# Patient Record
Sex: Female | Born: 1985 | Race: White | Hispanic: No | Marital: Married | Smoking: Current every day smoker
Health system: Southern US, Community
[De-identification: ages and names within clinical notes are randomized; demographics above are authoritative.]

## PROBLEM LIST (undated history)

## (undated) DIAGNOSIS — B1920 Unspecified viral hepatitis C without hepatic coma: Secondary | ICD-10-CM

## (undated) DIAGNOSIS — J449 Chronic obstructive pulmonary disease, unspecified: Secondary | ICD-10-CM

## (undated) DIAGNOSIS — F609 Personality disorder, unspecified: Secondary | ICD-10-CM

## (undated) DIAGNOSIS — G629 Polyneuropathy, unspecified: Secondary | ICD-10-CM

## (undated) DIAGNOSIS — F419 Anxiety disorder, unspecified: Secondary | ICD-10-CM

## (undated) DIAGNOSIS — M797 Fibromyalgia: Secondary | ICD-10-CM

## (undated) DIAGNOSIS — F329 Major depressive disorder, single episode, unspecified: Secondary | ICD-10-CM

## (undated) DIAGNOSIS — R569 Unspecified convulsions: Secondary | ICD-10-CM

## (undated) DIAGNOSIS — E669 Obesity, unspecified: Secondary | ICD-10-CM

## (undated) DIAGNOSIS — N390 Urinary tract infection, site not specified: Secondary | ICD-10-CM

## (undated) DIAGNOSIS — O1490 Unspecified pre-eclampsia, unspecified trimester: Secondary | ICD-10-CM

## (undated) DIAGNOSIS — F32A Depression, unspecified: Secondary | ICD-10-CM

## (undated) DIAGNOSIS — O1410 Severe pre-eclampsia, unspecified trimester: Secondary | ICD-10-CM

## (undated) DIAGNOSIS — D649 Anemia, unspecified: Secondary | ICD-10-CM

## (undated) DIAGNOSIS — R51 Headache: Secondary | ICD-10-CM

## (undated) HISTORY — PX: TONSILLECTOMY: SUR1361

## (undated) HISTORY — PX: WISDOM TOOTH EXTRACTION: SHX21

## (undated) HISTORY — PX: MOUTH SURGERY: SHX715

---
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
## 2012-02-29 ENCOUNTER — Emergency Department (HOSPITAL_COMMUNITY): Payer: Medicaid Other

## 2012-02-29 ENCOUNTER — Encounter (HOSPITAL_COMMUNITY): Payer: Self-pay | Admitting: Emergency Medicine

## 2012-02-29 ENCOUNTER — Emergency Department (HOSPITAL_COMMUNITY)
Admission: EM | Admit: 2012-02-29 | Discharge: 2012-02-29 | Disposition: A | Discharge status: Home / Self Care | Payer: Medicaid Other | Attending: Emergency Medicine | Admitting: Emergency Medicine

## 2012-02-29 DIAGNOSIS — F17200 Nicotine dependence, unspecified, uncomplicated: Secondary | ICD-10-CM | POA: Insufficient documentation

## 2012-02-29 DIAGNOSIS — M25551 Pain in right hip: Secondary | ICD-10-CM

## 2012-02-29 DIAGNOSIS — W010XXA Fall on same level from slipping, tripping and stumbling without subsequent striking against object, initial encounter: Secondary | ICD-10-CM | POA: Insufficient documentation

## 2012-02-29 DIAGNOSIS — S79919A Unspecified injury of unspecified hip, initial encounter: Secondary | ICD-10-CM | POA: Insufficient documentation

## 2012-02-29 DIAGNOSIS — Y929 Unspecified place or not applicable: Secondary | ICD-10-CM | POA: Insufficient documentation

## 2012-02-29 DIAGNOSIS — Y939 Activity, unspecified: Secondary | ICD-10-CM | POA: Insufficient documentation

## 2012-02-29 DIAGNOSIS — S79929A Unspecified injury of unspecified thigh, initial encounter: Secondary | ICD-10-CM | POA: Insufficient documentation

## 2012-02-29 HISTORY — DX: Unspecified pre-eclampsia, unspecified trimester: O14.90

## 2012-02-29 MED ORDER — KETOROLAC TROMETHAMINE 60 MG/2ML IM SOLN
60.0000 mg | Freq: Once | INTRAMUSCULAR | Status: AC
  Administered 2012-02-29: 60 mg via INTRAMUSCULAR
  Filled 2012-02-29: qty 2

## 2012-02-29 MED ORDER — OXYCODONE-ACETAMINOPHEN 5-325 MG PO TABS: 2.0000 | ORAL_TABLET | ORAL | Status: DC | PRN

## 2012-02-29 MED ORDER — OXYCODONE-ACETAMINOPHEN 5-325 MG PO TABS
2.0000 | ORAL_TABLET | Freq: Once | ORAL | Status: AC
  Administered 2012-02-29: 2 via ORAL
  Filled 2012-02-29: qty 2

## 2012-02-29 NOTE — ED Notes (Signed)
Patient states she tripped and fell over a tree stump and has right hip/right lower back pain.

## 2012-02-29 NOTE — ED Provider Notes (Signed)
History     CSN: [id number]  Arrival date & time 02/29/12  1515   First MD Initiated Contact with Patient 02/29/12 1725      Chief Complaint  Patient presents with  . Hip Pain    right hip and lower right back    (Consider location/radiation/quality/duration/timing/severity/associated sxs/prior treatment) HPI Comments: Patient is a 26 year old female who presents with right hip pain after tripping over a tree root and landing on her right hip. The pain is located in her right hip and does radiate to her back. The pain is described as aching and severe. The pain started gradually and progressively worsened since the onset. Pain made worse by movement and weight bearing. No alleviating factors. The patient has tried tylenol and ice for symptoms without relief. Associated symptoms include nothing. Patient denies fever, headache, NVD, chest pain, SOB, numbness/tingling, weakness, coolness of extremity. Patient denies any other injury.       Past Medical History  Diagnosis Date  . Pre-eclampsia     History reviewed. No pertinent past surgical history.  History reviewed. No pertinent family history.  History  Substance Use Topics  . Smoking status: Current Every Day Smoker -- 1.0 packs/day    Types: Cigarettes  . Smokeless tobacco: Not on file  . Alcohol Use: No    OB History    Grav Para Term Preterm Abortions TAB SAB Ect Mult Living                  Review of Systems  Musculoskeletal: Positive for back pain, arthralgias and gait problem.  All other systems reviewed and are negative.    Allergies  Latex; Penicillins; and Zyrtec  Home Medications  No current outpatient prescriptions on file.  BP 140/84  Pulse 65  Temp 98.3 F (36.8 C) (Oral)  Resp 20  Ht 5\' 2"  (1.575 m)  Wt 225 lb (102.059 kg)  BMI 41.15 kg/m2  SpO2 100%  LMP 02/26/2012  Physical Exam  Nursing note and vitals reviewed. Constitutional: She is oriented to person, place, and time. She  appears well-developed and well-nourished. No distress.  HENT:  Head: Normocephalic and atraumatic.  Eyes: Conjunctivae normal are normal.  Neck: Normal range of motion. Neck supple.  Cardiovascular: Normal rate, regular rhythm and intact distal pulses.  Exam reveals no gallop and no friction rub.   No murmur heard.      Sufficient capillary refill of distal extremities.   Pulmonary/Chest: Effort normal and breath sounds normal. She has no wheezes. She has no rales. She exhibits no tenderness.  Abdominal: Soft. She exhibits no distension.  Musculoskeletal: Normal range of motion. She exhibits tenderness.       Right hip tenderness to palpation of lateral joint.   Neurological: She is alert and oriented to person, place, and time. Coordination normal.       Strength and sensation equal and intact bilaterally. Speech is goal-oriented. Moves limbs without ataxia. Gait affected by right hip pain.   Skin: Skin is warm and dry. She is not diaphoretic.       No bruising of right hip.   Psychiatric: She has a normal mood and affect. Her behavior is normal.    ED Course  Procedures (including critical care time)  Labs Reviewed - No data to display Dg Lumbar Spine Complete  02/29/2012  *RADIOLOGY REPORT*  Clinical Data: Fall.  Low back pain.  LUMBAR SPINE - COMPLETE 4+ VIEW  Comparison: None.  Findings: Five lumbar  type vertebral bodies are present.  There is a mild S-shaped lumbar curvature.  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  The vertebral body height is preserved.  There are no pars defects.  No spondylolisthesis. Intervertebral disc spaces appear normal.  IMPRESSION: Negative.   Original Report Authenticated By: Andreas Newport, M.D.    Dg Hip Complete Right  02/29/2012  *RADIOLOGY REPORT*  Clinical Data: Fall.  Low back pain extending into the right hip.  RIGHT HIP - COMPLETE 2+ VIEW  Comparison: None.  Findings: Sacralization of the left L5 transverse process is  incidentally noted.  The SI joints appear within normal limits. Sacral arcades intact.  Obturator rings appear within normal limits.  The hip joint spaces are symmetric.  There is no fracture. Right obturator ring appears normal.  IMPRESSION: No acute osseous abnormality.   Original Report Authenticated By: Andreas Newport, M.D.      1. Hip pain, right       MDM  6:10 PM Patient's hip xrays are negative for fracture. Patient will be discharged with pain medication, crutches, and Ortho follow up if the pain does not resolve in a few days. I will give patient percocet here as she says she has a ride home. No signs of neurovascular compromise. Pain after injury, most likely contusion. No further evaluation needed at this time.         Emilia Beck, PA-C 03/05/12 [phone]

## 2012-03-06 NOTE — ED Provider Notes (Signed)
Medical screening examination/treatment/procedure(s) were performed by non-physician practitioner and as supervising physician I was immediately available for consultation/collaboration.  Toy Baker, MD 03/06/12 [phone]

## 2012-03-30 ENCOUNTER — Emergency Department (HOSPITAL_COMMUNITY)
Admission: EM | Admit: 2012-03-30 | Discharge: 2012-03-30 | Disposition: A | Discharge status: Home / Self Care | Payer: Medicaid Other | Attending: Emergency Medicine | Admitting: Emergency Medicine

## 2012-03-30 ENCOUNTER — Encounter (HOSPITAL_COMMUNITY): Payer: Self-pay

## 2012-03-30 DIAGNOSIS — N898 Other specified noninflammatory disorders of vagina: Secondary | ICD-10-CM | POA: Insufficient documentation

## 2012-03-30 DIAGNOSIS — N390 Urinary tract infection, site not specified: Secondary | ICD-10-CM

## 2012-03-30 DIAGNOSIS — M545 Low back pain, unspecified: Secondary | ICD-10-CM | POA: Insufficient documentation

## 2012-03-30 DIAGNOSIS — E669 Obesity, unspecified: Secondary | ICD-10-CM | POA: Insufficient documentation

## 2012-03-30 DIAGNOSIS — Z862 Personal history of diseases of the blood and blood-forming organs and certain disorders involving the immune mechanism: Secondary | ICD-10-CM | POA: Insufficient documentation

## 2012-03-30 DIAGNOSIS — R110 Nausea: Secondary | ICD-10-CM | POA: Insufficient documentation

## 2012-03-30 DIAGNOSIS — F17200 Nicotine dependence, unspecified, uncomplicated: Secondary | ICD-10-CM | POA: Insufficient documentation

## 2012-03-30 DIAGNOSIS — R350 Frequency of micturition: Secondary | ICD-10-CM | POA: Insufficient documentation

## 2012-03-30 DIAGNOSIS — Z8659 Personal history of other mental and behavioral disorders: Secondary | ICD-10-CM | POA: Insufficient documentation

## 2012-03-30 DIAGNOSIS — Z8669 Personal history of other diseases of the nervous system and sense organs: Secondary | ICD-10-CM | POA: Insufficient documentation

## 2012-03-30 DIAGNOSIS — R300 Dysuria: Secondary | ICD-10-CM | POA: Insufficient documentation

## 2012-03-30 DIAGNOSIS — Z79899 Other long term (current) drug therapy: Secondary | ICD-10-CM | POA: Insufficient documentation

## 2012-03-30 HISTORY — DX: Severe pre-eclampsia, unspecified trimester: O14.10

## 2012-03-30 HISTORY — DX: Anemia, unspecified: D64.9

## 2012-03-30 HISTORY — DX: Obesity, unspecified: E66.9

## 2012-03-30 HISTORY — DX: Headache: R51

## 2012-03-30 HISTORY — DX: Depression, unspecified: F32.A

## 2012-03-30 HISTORY — DX: Unspecified convulsions: R56.9

## 2012-03-30 HISTORY — DX: Major depressive disorder, single episode, unspecified: F32.9

## 2012-03-30 HISTORY — DX: Urinary tract infection, site not specified: N39.0

## 2012-03-30 LAB — URINALYSIS, ROUTINE W REFLEX MICROSCOPIC
Glucose, UA: NEGATIVE mg/dL
Protein, ur: NEGATIVE mg/dL
pH: 5.5 (ref 5.0–8.0)

## 2012-03-30 LAB — URINE MICROSCOPIC-ADD ON

## 2012-03-30 LAB — WET PREP, GENITAL
Clue Cells Wet Prep HPF POC: NONE SEEN
Trich, Wet Prep: NONE SEEN
Yeast Wet Prep HPF POC: NONE SEEN

## 2012-03-30 MED ORDER — DEXTROSE 5 % IV SOLN: 1.0000 g | Freq: Once | INTRAVENOUS | Status: DC

## 2012-03-30 MED ORDER — LEVOFLOXACIN IN D5W 750 MG/150ML IV SOLN
750.0000 mg | Freq: Once | INTRAVENOUS | Status: AC
  Administered 2012-03-30: 750 mg via INTRAVENOUS
  Filled 2012-03-30: qty 150

## 2012-03-30 MED ORDER — LEVOFLOXACIN 500 MG PO TABS: 500.0000 mg | ORAL_TABLET | Freq: Every day | ORAL | Status: DC

## 2012-03-30 NOTE — ED Notes (Signed)
Patient c/o left lower abdominal pain, left lower back and dysuria. Patient reports that she was seen by her PCP 2 weeks ago and was given an antibiotic x 7 days, but  Symptoms are worse.

## 2012-03-30 NOTE — ED Provider Notes (Signed)
History  This chart was scribed for Crystal Racer, MD by [name], ED Scribe. This patient was seen in room WA07/WA07 and the patient's care was started at 4:55 PM.  CSN: [id number]  Arrival date & time 03/30/12  1325   First MD Initiated Contact with Patient 03/30/12 1655      Chief Complaint  Patient presents with  . Abdominal Pain  . Back Pain  . Dysuria    Patient is a 26 y.o. female presenting with abdominal pain. The history is provided by the patient. No language interpreter was used.  Abdominal Pain The primary symptoms of the illness include abdominal pain, nausea, dysuria and vaginal discharge. The primary symptoms of the illness do not include fever, vomiting, diarrhea or vaginal bleeding. Episode onset: going on for 9 months since her pregnancy. The onset of the illness was gradual. The problem has been gradually worsening.  The abdominal pain is located in the suprapubic region. The abdominal pain radiates to the back. The abdominal pain is relieved by nothing. The abdominal pain is exacerbated by vomiting.  The dysuria is associated with frequency and urgency. The dysuria is not associated with hematuria.  The vaginal discharge is associated with dysuria.  The patient has not had a change in bowel habit. Additional symptoms associated with the illness include urgency, frequency and back pain. Symptoms associated with the illness do not include chills or hematuria. Significant associated medical issues do not include GERD, inflammatory bowel disease or diabetes.    Crystal Ayala is a 26 y.o. female who presents to the Emergency Department complaining of 9 months of recurrent lower abdominal pain that radiates to the lower back with associated nausea, urgency, frequency, dysuria, difficulty urinating and decreased urine that she attributes to a recurrent UTI since her the early part of her pregnancy (son is 80 months old currently). She states that she has been seen by  her PCP 2 to 3 weeks ago and has been treated with antibiotics ( an unknown 7 day antibiotic). She reports that the symptoms improved with the antibiotic but have come back and have been gradually worsening since. She also reports mild vaginal discharge but states that she had a pelvic exam performed by her PCP 2 to 3 weeks ago that was normal. She states that she gets finished her menstrual cycle and states that it was normal. She denies fever, emesis and hematuria as associated symptoms. She has a h/o depression, seizure and anemia. She is a current everyday smoker but denies alcohol use.  [facility] is PCP.   Past Medical History  Diagnosis Date  . Pre-eclampsia   . UTI (lower urinary tract infection)   . Headache   . Depression   . Obesity   . Preeclampsia, severe   . Seizure   . Anemia     Past Surgical History  Procedure Date  . Tonsillectomy   . Mouth surgery     Family History  Problem Relation Age of Onset  . Diabetes Mother   . Heart failure Mother   . Diabetes Father   . Heart failure Father     History  Substance Use Topics  . Smoking status: Current Every Day Smoker -- 1.0 packs/day    Types: Cigarettes  . Smokeless tobacco: Never Used  . Alcohol Use: No    No OB history provided.  Review of Systems  Constitutional: Negative for fever and chills.  Gastrointestinal: Positive for nausea and abdominal pain. Negative for vomiting and  diarrhea.  Genitourinary: Positive for dysuria, urgency, frequency, decreased urine volume, vaginal discharge and difficulty urinating. Negative for hematuria, flank pain and vaginal bleeding.  Musculoskeletal: Positive for back pain.  All other systems reviewed and are negative.    Allergies  Latex; Penicillins-throat swells per pt; and Zyrtec  Home Medications   Current Outpatient Rx  Name  Route  Sig  Dispense  Refill  . OXYCODONE-ACETAMINOPHEN 5-325 MG PO TABS   Oral   Take 2 tablets by mouth every 4 (four)  hours as needed for pain.   15 tablet   0   . SULFAMETHOXAZOLE-TMP DS 800-160 MG PO TABS   Oral   Take 1 tablet by mouth 2 (two) times daily.         Marland Kitchen LEVOFLOXACIN 500 MG PO TABS   Oral   Take 1 tablet (500 mg total) by mouth daily.   7 tablet   0      Triage Vitals: BP 127/90  Pulse 101  Temp 98 F (36.7 C) (Oral)  Resp 16  SpO2 97%  LMP 03/28/2012  Physical Exam  Nursing note and vitals reviewed. Constitutional: She is oriented to person, place, and time. She appears well-developed and well-nourished. No distress.  HENT:  Head: Normocephalic and atraumatic.  Mouth/Throat: Oropharynx is clear and moist.  Eyes: Conjunctivae normal and EOM are normal. Pupils are equal, round, and reactive to light.  Neck: Normal range of motion. Neck supple. No tracheal deviation present.  Cardiovascular: Normal rate, regular rhythm and normal heart sounds.  Exam reveals no gallop and no friction rub.   No murmur heard. Pulmonary/Chest: Effort normal and breath sounds normal. No respiratory distress.  Abdominal: Soft. She exhibits no mass. There is no tenderness. There is no rebound and no guarding.       No CVA tenderness  Genitourinary: Vaginal discharge found.       Mild amount of brownish discharge from the cervical os, no cervical motion tenderness, mild fundal tenderness  Musculoskeletal: Normal range of motion.       No flank tenderness  Neurological: She is alert and oriented to person, place, and time.  Skin: Skin is warm and dry.  Psychiatric: She has a normal mood and affect. Her behavior is normal.    ED Course  Procedures (including critical care time)  DIAGNOSTIC STUDIES: Oxygen Saturation is 97% on room air, adequate by my interpretation.    COORDINATION OF CARE: 5:20 PM- Discussed treatment plan which includes an UA and pelvic exam with pt at bedside and pt agreed to plan.  6:40 PM- informed pt of UA results. Discussed further treatment plan which includes IV  antibiotics and with pt at bedside and pt agreed to plan. Advised pt that she will need to follow up with PCP and she agreed.  6:45 PM- Ordered IV 750 mg Levaquin.  Labs Reviewed  URINALYSIS, ROUTINE W REFLEX MICROSCOPIC - Abnormal; Notable for the following:    APPearance CLOUDY (*)     Hgb urine dipstick LARGE (*)     Leukocytes, UA MODERATE (*)     All other components within normal limits  WET PREP, GENITAL - Abnormal; Notable for the following:    WBC, Wet Prep HPF POC FEW (*)     All other components within normal limits  URINE MICROSCOPIC-ADD ON - Abnormal; Notable for the following:    Squamous Epithelial / LPF FEW (*)     Bacteria, UA FEW (*)     All  other components within normal limits  PREGNANCY, URINE  GC/CHLAMYDIA PROBE AMP  URINE CULTURE   No results found.   1. UTI (urinary tract infection)       MDM  I personally performed the services described in this documentation, which was scribed in my presence. The recorded information has been reviewed and is accurate.    Crystal Racer, MD 03/31/12 [phone]

## 2012-03-30 NOTE — ED Notes (Signed)
Pt c/o lower abdominal and back pain. Pt sts hx of frequent UTI's. PT sts this feels like UTI. Pt also c/o burning with urination.

## 2012-03-31 LAB — GC/CHLAMYDIA PROBE AMP
CT Probe RNA: NEGATIVE
GC Probe RNA: NEGATIVE

## 2012-04-01 LAB — URINE CULTURE: Colony Count: >=100000

## 2012-04-02 NOTE — ED Notes (Signed)
+  Urine. Patient treated with Levaquin. Sensitive to same. Per protocol MD. °

## 2013-04-01 ENCOUNTER — Encounter (HOSPITAL_COMMUNITY): Payer: Self-pay | Admitting: Emergency Medicine

## 2013-04-01 ENCOUNTER — Emergency Department (HOSPITAL_COMMUNITY)
Admission: EM | Admit: 2013-04-01 | Discharge: 2013-04-01 | Disposition: A | Discharge status: Home / Self Care | Payer: Medicaid Other | Attending: Emergency Medicine | Admitting: Emergency Medicine

## 2013-04-01 DIAGNOSIS — R569 Unspecified convulsions: Secondary | ICD-10-CM | POA: Insufficient documentation

## 2013-04-01 DIAGNOSIS — Z880 Allergy status to penicillin: Secondary | ICD-10-CM | POA: Insufficient documentation

## 2013-04-01 DIAGNOSIS — Z888 Allergy status to other drugs, medicaments and biological substances status: Secondary | ICD-10-CM | POA: Insufficient documentation

## 2013-04-01 DIAGNOSIS — Z87440 Personal history of urinary (tract) infections: Secondary | ICD-10-CM | POA: Insufficient documentation

## 2013-04-01 DIAGNOSIS — J441 Chronic obstructive pulmonary disease with (acute) exacerbation: Secondary | ICD-10-CM | POA: Insufficient documentation

## 2013-04-01 DIAGNOSIS — R9431 Abnormal electrocardiogram [ECG] [EKG]: Secondary | ICD-10-CM | POA: Insufficient documentation

## 2013-04-01 DIAGNOSIS — Z3202 Encounter for pregnancy test, result negative: Secondary | ICD-10-CM | POA: Insufficient documentation

## 2013-04-01 DIAGNOSIS — F3289 Other specified depressive episodes: Secondary | ICD-10-CM | POA: Insufficient documentation

## 2013-04-01 DIAGNOSIS — Z91040 Latex allergy status: Secondary | ICD-10-CM | POA: Insufficient documentation

## 2013-04-01 DIAGNOSIS — IMO0001 Reserved for inherently not codable concepts without codable children: Secondary | ICD-10-CM | POA: Insufficient documentation

## 2013-04-01 DIAGNOSIS — R1032 Left lower quadrant pain: Secondary | ICD-10-CM | POA: Insufficient documentation

## 2013-04-01 DIAGNOSIS — F17200 Nicotine dependence, unspecified, uncomplicated: Secondary | ICD-10-CM | POA: Insufficient documentation

## 2013-04-01 DIAGNOSIS — R079 Chest pain, unspecified: Secondary | ICD-10-CM | POA: Insufficient documentation

## 2013-04-01 DIAGNOSIS — D649 Anemia, unspecified: Secondary | ICD-10-CM | POA: Insufficient documentation

## 2013-04-01 DIAGNOSIS — Z8742 Personal history of other diseases of the female genital tract: Secondary | ICD-10-CM | POA: Insufficient documentation

## 2013-04-01 DIAGNOSIS — Z79899 Other long term (current) drug therapy: Secondary | ICD-10-CM | POA: Insufficient documentation

## 2013-04-01 DIAGNOSIS — R109 Unspecified abdominal pain: Secondary | ICD-10-CM | POA: Insufficient documentation

## 2013-04-01 DIAGNOSIS — F329 Major depressive disorder, single episode, unspecified: Secondary | ICD-10-CM | POA: Insufficient documentation

## 2013-04-01 DIAGNOSIS — E669 Obesity, unspecified: Secondary | ICD-10-CM | POA: Insufficient documentation

## 2013-04-01 DIAGNOSIS — Z8669 Personal history of other diseases of the nervous system and sense organs: Secondary | ICD-10-CM | POA: Insufficient documentation

## 2013-04-01 LAB — URINALYSIS, ROUTINE W REFLEX MICROSCOPIC
Bilirubin Urine: NEGATIVE
Glucose, UA: NEGATIVE mg/dL
Hgb urine dipstick: NEGATIVE
Ketones, ur: NEGATIVE mg/dL
Protein, ur: NEGATIVE mg/dL
pH: 6 (ref 5.0–8.0)

## 2013-04-01 LAB — URINE MICROSCOPIC-ADD ON

## 2013-04-01 MED ORDER — AZITHROMYCIN 250 MG PO TABS: ORAL_TABLET | ORAL | Status: AC

## 2013-04-01 MED ORDER — TRAMADOL HCL 50 MG PO TABS
50.0000 mg | ORAL_TABLET | Freq: Once | ORAL | Status: AC
  Administered 2013-04-01: 50 mg via ORAL
  Filled 2013-04-01: qty 1

## 2013-04-01 NOTE — ED Notes (Signed)
Pt here with multiple complaints.  Pt states she has been dizzy and had a cough with green sputum all week.  Pt also c/o CP, abdominal pain LLQ, UTI symptoms, and back pain.  Pt states she has taken Ibuprofen for her symptoms.

## 2013-04-01 NOTE — ED Notes (Signed)
Pt discharged.Vital signs stable and GCS 15.Discharge instruction given. 

## 2013-04-02 NOTE — ED Provider Notes (Signed)
CSN: [id number]     Arrival date & time 04/01/13  1718 History   First MD Initiated Contact with Patient 04/01/13 1841     Chief Complaint  Patient presents with  . Cough/congestion  . Lower abdominal pain/ poss UTI      (Consider location/radiation/quality/duration/timing/severity/associated sxs/prior Treatment) HPI Crystal Ayala is a 27 y.o. female who presents to the emergency department with two complaints.  Lower abdominal pain: Patient reports that she is having LLQ pain.  Similar to numerous UTIs she has had in the past.  Associated with dysuria and malodorous urine.  No flank pain.  No fevers.  No vaginal discharge.  No problems with tolerating PO or defecation.  Still passing flatus.  Moderate in severity.  No pain radiation.  Worse with palpation.  Better with nothing. Cough/congestion: patient reports that she has smoked 2-3 packs a day for last 12 years of her life and has already been diagnosed with COPD at [facility] and is being treated with Advair and Albuterol.  She reports that over the last three days she has had an increase in mucous production and change in color.  She also reports a worsening cough during this time.  Described as mild.  No SOB, chest pain, or other symptoms.  Past Medical History  Diagnosis Date  . Pre-eclampsia   . UTI (lower urinary tract infection)   . Headache(784.0)   . Depression   . Obesity   . Preeclampsia, severe   . Seizure   . Anemia    Past Surgical History  Procedure Laterality Date  . Tonsillectomy    . Mouth surgery     Family History  Problem Relation Age of Onset  . Diabetes Mother   . Heart failure Mother   . Diabetes Father   . Heart failure Father    History  Substance Use Topics  . Smoking status: Current Every Day Smoker -- 1.00 packs/day    Types: Cigarettes  . Smokeless tobacco: Never Used  . Alcohol Use: No   OB History   Grav Para Term Preterm Abortions TAB SAB Ect Mult Living                  Review of Systems  Constitutional: Negative for fever and chills.  HENT: Negative for congestion and rhinorrhea.   Respiratory: Negative for cough and shortness of breath.   Cardiovascular: Negative for chest pain.  Gastrointestinal: Negative for nausea, vomiting, abdominal pain, diarrhea and abdominal distention.  Endocrine: Negative for polyuria.  Genitourinary: Negative for dysuria.  Musculoskeletal: Negative for neck pain and neck stiffness.  Skin: Negative for rash.  Neurological: Negative for headaches.  Psychiatric/Behavioral: Negative.     Allergies  Latex; Penicillins; Other; and Zyrtec  Home Medications   Current Outpatient Rx  Name  Route  Sig  Dispense  Refill  . ibuprofen (ADVIL,MOTRIN) 200 MG tablet   Oral   Take 800 mg by mouth every 8 (eight) hours as needed for moderate pain.         . pregabalin (LYRICA) 100 MG capsule   Oral   Take 100 mg by mouth 3 (three) times daily.         Marland Kitchen azithromycin (ZITHROMAX Z-PAK) 250 MG tablet      Follow package directions.  Take 500mg  by mouth on day 1 and 250mg  by mouth on days 2-5.   6 tablet   0    BP 114/66  Pulse 73  Temp(Src) 98.3 F (36.8  C) (Oral)  Resp 18  SpO2 98%  LMP 03/18/2013 Physical Exam  Nursing note and vitals reviewed. Constitutional: She is oriented to person, place, and time. She appears well-developed and well-nourished. No distress.  HENT:  Head: Normocephalic and atraumatic.  Right Ear: External ear normal.  Left Ear: External ear normal.  Nose: Nose normal.  Mouth/Throat: Oropharynx is clear and moist. No oropharyngeal exudate.  Eyes: EOM are normal. Pupils are equal, round, and reactive to light.  Neck: Normal range of motion. Neck supple. No tracheal deviation present.  Cardiovascular: Normal rate.   Pulmonary/Chest: Effort normal and breath sounds normal. No stridor. No respiratory distress. She has no wheezes. She has no rales.  Abdominal: Soft. She exhibits no distension.  There is no tenderness. There is no rebound.  Musculoskeletal: Normal range of motion.  Neurological: She is alert and oriented to person, place, and time.  Skin: Skin is warm and dry. She is not diaphoretic.    ED Course  Procedures (including critical care time) Labs Review Labs Reviewed  URINALYSIS, ROUTINE W REFLEX MICROSCOPIC - Abnormal; Notable for the following:    Leukocytes, UA TRACE (*)    All other components within normal limits  URINE MICROSCOPIC-ADD ON - Abnormal; Notable for the following:    Squamous Epithelial / LPF FEW (*)    Bacteria, UA FEW (*)    All other components within normal limits  POCT PREGNANCY, URINE   Imaging Review No results found.  EKG Interpretation    Date/Time:  Saturday April 01 2013 17:25:45 EST Ventricular Rate:  88 PR Interval:  106 QRS Duration: 86 QT Interval:  348 QTC Calculation: 421 R Axis:   45 Text Interpretation:  Sinus rhythm with short PR Cannot rule out Anterior infarct , age undetermined Abnormal ECG Confirmed by Bebe Shaggy  MD, DONALD [phone]) on 04/01/2013 7:25:35 PM            MDM   1. COPD exacerbation    Crystal Ayala is a 27 y.o. female with history of COPD, fibromyalgia, and possible interstitial cystitis who presents to the ED with cough, congestion and LLQ pain similar to previous UTIs.  UA checked and negtive for infection.  Plan to do pelvic exam for torsion/TOA/other pelvic pathology but patient declined.  Given that patient is actually being treated for COPD, we will treat this as a COPD exacerbation.  Azithromycin provided.  Doubt other seious intrathoracic cause of pathology.  Patient able to ambulate easily unassisted and with mild abdominal pain.  o further workup indicated at this point in time.  Recommend patient f/u with PCP.  Patient safe for discharge.  Patient discharged.    Arloa Koh, MD 04/02/13 [phone]

## 2013-04-02 NOTE — ED Provider Notes (Signed)
I have personally seen and examined the patient.  I have discussed the plan of care with the resident.  I have reviewed the documentation on PMH/FH/Soc. History.  I have reviewed the documentation of the resident and agree.  I have reviewed and agree with the ECG interpretation(s) documented by the resident.  Pt stable/well appearing, no distress, watching TV.  Appropriate for outpatient management   Crystal Gaskins, MD 04/02/13 2133

## 2016-12-11 DIAGNOSIS — F17200 Nicotine dependence, unspecified, uncomplicated: Secondary | ICD-10-CM

## 2016-12-11 DIAGNOSIS — R0902 Hypoxemia: Secondary | ICD-10-CM

## 2016-12-11 DIAGNOSIS — R0602 Shortness of breath: Secondary | ICD-10-CM

## 2016-12-11 DIAGNOSIS — J441 Chronic obstructive pulmonary disease with (acute) exacerbation: Secondary | ICD-10-CM

## 2016-12-11 DIAGNOSIS — E876 Hypokalemia: Secondary | ICD-10-CM

## 2016-12-16 DIAGNOSIS — J45909 Unspecified asthma, uncomplicated: Secondary | ICD-10-CM

## 2016-12-16 DIAGNOSIS — F17200 Nicotine dependence, unspecified, uncomplicated: Secondary | ICD-10-CM

## 2016-12-16 DIAGNOSIS — F1910 Other psychoactive substance abuse, uncomplicated: Secondary | ICD-10-CM

## 2016-12-16 DIAGNOSIS — J069 Acute upper respiratory infection, unspecified: Secondary | ICD-10-CM

## 2016-12-17 DIAGNOSIS — R0602 Shortness of breath: Secondary | ICD-10-CM

## 2018-01-19 ENCOUNTER — Emergency Department (HOSPITAL_COMMUNITY)
Admission: EM | Admit: 2018-01-19 | Discharge: 2018-01-20 | Disposition: A | Discharge status: Home / Self Care | Payer: Self-pay | Attending: Emergency Medicine | Admitting: Emergency Medicine

## 2018-01-19 ENCOUNTER — Encounter (HOSPITAL_COMMUNITY): Payer: Self-pay

## 2018-01-19 ENCOUNTER — Other Ambulatory Visit: Payer: Self-pay

## 2018-01-19 ENCOUNTER — Emergency Department (HOSPITAL_COMMUNITY): Payer: Self-pay

## 2018-01-19 DIAGNOSIS — N390 Urinary tract infection, site not specified: Secondary | ICD-10-CM | POA: Insufficient documentation

## 2018-01-19 DIAGNOSIS — M545 Low back pain: Secondary | ICD-10-CM | POA: Insufficient documentation

## 2018-01-19 DIAGNOSIS — J45901 Unspecified asthma with (acute) exacerbation: Secondary | ICD-10-CM | POA: Insufficient documentation

## 2018-01-19 DIAGNOSIS — F17210 Nicotine dependence, cigarettes, uncomplicated: Secondary | ICD-10-CM | POA: Insufficient documentation

## 2018-01-19 DIAGNOSIS — M7918 Myalgia, other site: Secondary | ICD-10-CM | POA: Insufficient documentation

## 2018-01-19 DIAGNOSIS — Z720 Tobacco use: Secondary | ICD-10-CM

## 2018-01-19 DIAGNOSIS — Z79899 Other long term (current) drug therapy: Secondary | ICD-10-CM | POA: Insufficient documentation

## 2018-01-19 HISTORY — DX: Personality disorder, unspecified: F60.9

## 2018-01-19 HISTORY — DX: Polyneuropathy, unspecified: G62.9

## 2018-01-19 HISTORY — DX: Anxiety disorder, unspecified: F41.9

## 2018-01-19 HISTORY — DX: Chronic obstructive pulmonary disease, unspecified: J44.9

## 2018-01-19 HISTORY — DX: Unspecified viral hepatitis C without hepatic coma: B19.20

## 2018-01-19 LAB — URINALYSIS, ROUTINE W REFLEX MICROSCOPIC
Bilirubin Urine: NEGATIVE
Glucose, UA: NEGATIVE mg/dL
Hgb urine dipstick: NEGATIVE
Ketones, ur: NEGATIVE mg/dL
Nitrite: NEGATIVE
Protein, ur: 30 mg/dL — AB
Specific Gravity, Urine: 1.019 (ref 1.005–1.030)
pH: 6 (ref 5.0–8.0)

## 2018-01-19 LAB — CBC WITH DIFFERENTIAL/PLATELET
Basophils Absolute: 0 K/uL (ref 0.0–0.1)
Basophils Relative: 0 %
Eosinophils Absolute: 0.2 K/uL (ref 0.0–0.7)
Eosinophils Relative: 4 %
HCT: 38.2 % (ref 36.0–46.0)
Hemoglobin: 13.4 g/dL (ref 12.0–15.0)
Lymphocytes Relative: 20 %
Lymphs Abs: 1 K/uL (ref 0.7–4.0)
MCH: 33.6 pg (ref 26.0–34.0)
MCHC: 35.1 g/dL (ref 30.0–36.0)
MCV: 95.7 fL (ref 78.0–100.0)
Monocytes Absolute: 0.3 K/uL (ref 0.1–1.0)
Monocytes Relative: 6 %
Neutro Abs: 3.4 K/uL (ref 1.7–7.7)
Neutrophils Relative %: 70 %
Platelets: 186 K/uL (ref 150–400)
RBC: 3.99 MIL/uL (ref 3.87–5.11)
RDW: 12.6 % (ref 11.5–15.5)
WBC: 5 K/uL (ref 4.0–10.5)

## 2018-01-19 LAB — BASIC METABOLIC PANEL WITH GFR
Anion gap: 11 (ref 5–15)
BUN: 8 mg/dL (ref 6–20)
CO2: 24 mmol/L (ref 22–32)
Calcium: 8.6 mg/dL — ABNORMAL LOW (ref 8.9–10.3)
Chloride: 103 mmol/L (ref 98–111)
Creatinine, Ser: 0.58 mg/dL (ref 0.44–1.00)
GFR calc Af Amer: >60 mL/min (ref >60)
GFR calc non Af Amer: >60 mL/min (ref >60)
Glucose, Bld: 106 mg/dL — ABNORMAL HIGH (ref 70–99)
Potassium: 3.1 mmol/L — ABNORMAL LOW (ref 3.5–5.1)
Sodium: 138 mmol/L (ref 135–145)

## 2018-01-19 MED ORDER — SODIUM CHLORIDE 0.9 % IV BOLUS
500.0000 mL | Freq: Once | INTRAVENOUS | Status: AC
  Administered 2018-01-19: 500 mL via INTRAVENOUS

## 2018-01-19 MED ORDER — SODIUM CHLORIDE 0.9 % IV SOLN
INTRAVENOUS | Status: DC
  Administered 2018-01-19: 23:00:00 via INTRAVENOUS

## 2018-01-19 NOTE — ED Provider Notes (Signed)
[facility]-EMERGENCY DEPT Provider Note   CSN: [id number] Arrival date & time: 01/19/18  2046     History   Chief Complaint Chief Complaint  Patient presents with  . Shortness of Breath    HPI Crystal Ayala is a 32 y.o. female.  HPI   She complains of being ill for several days with cough, achiness, wheezing, low back pain, and general malaise without fever, chills or vomiting.  She is taking her usual medications as prescribed.  She continues to smoke cigarettes.  Past Medical History:  Diagnosis Date  . Anxiety   . COPD (chronic obstructive pulmonary disease) (HCC)   . Hepatitis C   . Neuropathy   . Personality disorder (HCC)     There are no active problems to display for this patient.   Past Surgical History:  Procedure Laterality Date  . TONSILLECTOMY       OB History   None      Home Medications    Prior to Admission medications   Medication Sig Start Date End Date Taking? Authorizing Provider  ibuprofen (ADVIL,MOTRIN) 200 MG tablet Take 800 mg by mouth every 6 (six) hours as needed for moderate pain.   Yes [provider]  Melatonin 10 MG TABS Take 20 mg by mouth at bedtime.   Yes [provider]  Phenyleph-CPM-DM-Aspirin (ALKA-SELTZER PLUS COLD & COUGH PO) Take 2 tablets by mouth every 4 (four) hours as needed (cold and cough). Dissolve in water and drink   Yes [provider]  vitamin B-12 (CYANOCOBALAMIN) 1000 MCG tablet Take 1,000 mcg by mouth daily.   Yes [provider]  nitrofurantoin, macrocrystal-monohydrate, (MACROBID) 100 MG capsule Take 1 capsule (100 mg total) by mouth 2 (two) times daily. X 7 days 01/20/18   Mancel Bale, MD  predniSONE (DELTASONE) 20 MG tablet Take 1 tablet (20 mg total) by mouth 2 (two) times daily. 01/20/18   Mancel Bale, MD    Family History No family history on file.  Social History Social History   Tobacco Use  . Smoking status: Current Every Day  Smoker    Packs/day: 0.50    Types: Cigarettes  . Smokeless tobacco: Never Used  Substance Use Topics  . Alcohol use: Never    Frequency: Never  . Drug use: Never     Allergies   Penicillins; Buspar [buspirone]; and Cetirizine & related   Review of Systems Review of Systems  All other systems reviewed and are negative.    Physical Exam Updated Vital Signs BP 124/65 (BP Location: Right Arm)   Pulse (!) 107   Temp 99.3 F (37.4 C) (Oral)   Resp 12   Ht 5\' 3"  (1.6 m)   Wt 104.3 kg   LMP 01/12/2018 (Exact Date)   SpO2 97%   BMI 40.74 kg/m   Physical Exam  Constitutional: She is oriented to person, place, and time. She appears well-developed and well-nourished. She does not appear ill.  HENT:  Head: Normocephalic and atraumatic.  Eyes: Pupils are equal, round, and reactive to light. Conjunctivae and EOM are normal.  Neck: Normal range of motion and phonation normal. Neck supple.  Cardiovascular: Normal rate and regular rhythm.  Pulmonary/Chest: Effort normal. No respiratory distress. She has wheezes (Few scattered). She has no rales. She exhibits no tenderness.  Abdominal: Soft. She exhibits no distension. There is no tenderness. There is no guarding.  Musculoskeletal: Normal range of motion. She exhibits no edema or deformity.  Neurological: She  is alert and oriented to person, place, and time. She exhibits normal muscle tone.  Skin: Skin is warm and dry.  Psychiatric: She has a normal mood and affect. Her behavior is normal. Judgment and thought content normal.  Nursing note and vitals reviewed.    ED Treatments / Results  Labs (all labs ordered are listed, but only abnormal results are displayed) Labs Reviewed  BASIC METABOLIC PANEL - Abnormal; Notable for the following components:      Result Value   Potassium 3.1 (*)    Glucose, Bld 106 (*)    Calcium 8.6 (*)    All other components within normal limits  URINALYSIS, ROUTINE W REFLEX MICROSCOPIC -  Abnormal; Notable for the following components:   APPearance CLOUDY (*)    Protein, ur 30 (*)    Leukocytes, UA MODERATE (*)    Bacteria, UA RARE (*)    All other components within normal limits  CBC WITH DIFFERENTIAL/PLATELET  INFLUENZA PANEL BY PCR (TYPE A & B)    EKG None  Radiology Dg Chest 2 View  Result Date: 01/19/2018 CLINICAL DATA:  32 y/o F; abnormal breath sounds, difficulty breathing, history of COPD. Current smoker. EXAM: CHEST - 2 VIEW COMPARISON:  None. FINDINGS: The heart size and mediastinal contours are within normal limits. Mild bronchitic changes. No focal consolidation, effusion, or pneumothorax. The visualized skeletal structures are unremarkable. IMPRESSION: Mild bronchitic changes. No focal consolidation. Normal cardiac silhouette. Electronically Signed   By: Mitzi Hansen M.D.   On: 01/19/2018 22:31    Procedures Procedures (including critical care time)  Medications Ordered in ED Medications  0.9 %  sodium chloride infusion ( Intravenous New Bag/Given 01/19/18 2301)  sodium chloride 0.9 % bolus 500 mL (0 mLs Intravenous Stopped 01/19/18 2256)     Initial Impression / Assessment and Plan / ED Course  I have reviewed the triage vital signs and the nursing notes.  Pertinent labs & imaging results that were available during my care of the patient were reviewed by me and considered in my medical decision making (see chart for details).  Clinical Course as of Jan 21 8  Wed Jan 19, 2018  2317 Normal except cloudy appearance, increased protein, increase leukocytes, increased WBC, increased bacteria and increased squamous epithelial cells  Urinalysis, Routine w reflex microscopic(!) [EW]  2317 Normal except potassium low, glucose high, calcium low  Basic metabolic panel(!) [EW]  2318 Normal  CBC with Differential [EW]  2318 Bronchitis without infiltrate or CHF, images reviewed by me   [EW]    Clinical Course User Index [EW] Mancel Bale, MD      Patient Vitals for the past 24 hrs:  BP Temp Temp src Pulse Resp SpO2 Height Weight  01/19/18 2326 124/65 99.3 F (37.4 C) Oral (!) 107 12 97 % - -  01/19/18 2256 118/73 - - (!) 101 (!) 26 98 % - -  01/19/18 2245 - - - (!) 115 (!) 35 99 % - -  01/19/18 2230 - - - (!) 102 (!) 22 99 % - -  01/19/18 2215 - - - (!) 110 (!) 37 100 % - -  01/19/18 2145 - - - (!) 114 (!) 21 96 % - -  01/19/18 2130 116/82 - - (!) 113 (!) 31 96 % - -  01/19/18 2110 125/81 98.9 F (37.2 C) Oral (!) 118 (!) 25 97 % - -  01/19/18 2104 - - - - - - 5\' 3"  (1.6 m) 104.3  kg  01/19/18 2058 - - - - - 94 % - -    12:09 AM Reevaluation with update and discussion. After initial assessment and treatment, an updated evaluation reveals she is comfortable at this time and has no further complaints.  Findings discussed and questions answered. Mancel Bale   Medical Decision Making: Evaluation consistent with bronchitis secondary to tobacco abuse.  Doubt pneumonia, serious bacterial infection or metabolic instability.  Possible UTI however the sample appears contaminated.  Doubt sepsis.  CRITICAL CARE-no Performed by: Mancel Bale   Nursing Notes Reviewed/ Care Coordinated Applicable Imaging Reviewed Interpretation of Laboratory Data incorporated into ED treatment  The patient appears reasonably screened and/or stabilized for discharge and I doubt any other medical condition or other [facility] requiring further screening, evaluation, or treatment in the ED at this time prior to discharge.  Plan: Home Medications-OTC analgesia as needed; Home Treatments-rest, fluids; return here if the recommended treatment, does not improve the symptoms; Recommended follow up-PCP, PRN     Final Clinical Impressions(s) / ED Diagnoses   Final diagnoses:  Moderate asthma with exacerbation, unspecified whether persistent  Tobacco abuse  Urinary tract infection without hematuria, site unspecified    ED Discharge Orders         Ordered      predniSONE (DELTASONE) 20 MG tablet  2 times daily     01/20/18 0007    nitrofurantoin, macrocrystal-monohydrate, (MACROBID) 100 MG capsule  2 times daily     01/20/18 0007           Mancel Bale, MD 01/20/18 0010

## 2018-01-19 NOTE — ED Notes (Signed)
Turkey sandwich given to pt.  

## 2018-01-19 NOTE — ED Notes (Signed)
Bed: HY85 Expected date:  Expected time:  Means of arrival:  Comments: Wheezing, asthma

## 2018-01-19 NOTE — ED Notes (Signed)
Sprite given to pt per EDP.

## 2018-01-19 NOTE — ED Triage Notes (Signed)
Pt arrives today by [facility] due to difficulty breathing. Pt has a hx of COPD, but not taking any medications due to financial barriers. Per EMS, rhonchi and wheezes heard in all lung fields.   Per EMS: 10 mg albuterol 0.5mg  Atrovent 125 mg solumedrol   22 G forearm

## 2018-01-20 LAB — INFLUENZA PANEL BY PCR (TYPE A & B)
Influenza A By PCR: NEGATIVE
Influenza B By PCR: NEGATIVE

## 2018-01-20 MED ORDER — NITROFURANTOIN MONOHYD MACRO 100 MG PO CAPS: 100.0000 mg | ORAL_CAPSULE | Freq: Two times a day (BID) | ORAL | 0 refills | Status: DC

## 2018-01-20 MED ORDER — PREDNISONE 20 MG PO TABS: 20.0000 mg | ORAL_TABLET | Freq: Two times a day (BID) | ORAL | 0 refills | Status: DC

## 2018-01-20 NOTE — Discharge Instructions (Addendum)
The test indicates that you have an asthma exacerbation causing her trouble breathing and coughing.  You may have a urinary tract infection.  We are giving a prescription for prednisone and an antibiotic to improve your condition.  Try to stop smoking.  Follow-up with a primary care doctor for checkup as needed.

## 2018-10-01 ENCOUNTER — Other Ambulatory Visit: Payer: Self-pay

## 2018-10-01 ENCOUNTER — Encounter (HOSPITAL_COMMUNITY): Payer: Self-pay | Admitting: *Deleted

## 2018-10-01 ENCOUNTER — Inpatient Hospital Stay (HOSPITAL_COMMUNITY)
Admission: AD | Admit: 2018-10-01 | Discharge: 2018-10-02 | Disposition: A | Discharge status: Home / Self Care | Payer: Self-pay | Attending: Obstetrics & Gynecology | Admitting: Obstetrics & Gynecology

## 2018-10-01 DIAGNOSIS — O99511 Diseases of the respiratory system complicating pregnancy, first trimester: Secondary | ICD-10-CM | POA: Insufficient documentation

## 2018-10-01 DIAGNOSIS — O009 Unspecified ectopic pregnancy without intrauterine pregnancy: Secondary | ICD-10-CM | POA: Insufficient documentation

## 2018-10-01 DIAGNOSIS — Z3A08 8 weeks gestation of pregnancy: Secondary | ICD-10-CM | POA: Insufficient documentation

## 2018-10-01 DIAGNOSIS — M797 Fibromyalgia: Secondary | ICD-10-CM | POA: Insufficient documentation

## 2018-10-01 DIAGNOSIS — O99331 Smoking (tobacco) complicating pregnancy, first trimester: Secondary | ICD-10-CM | POA: Insufficient documentation

## 2018-10-01 DIAGNOSIS — G629 Polyneuropathy, unspecified: Secondary | ICD-10-CM | POA: Insufficient documentation

## 2018-10-01 DIAGNOSIS — O26891 Other specified pregnancy related conditions, first trimester: Secondary | ICD-10-CM | POA: Insufficient documentation

## 2018-10-01 DIAGNOSIS — J449 Chronic obstructive pulmonary disease, unspecified: Secondary | ICD-10-CM | POA: Insufficient documentation

## 2018-10-01 DIAGNOSIS — M7989 Other specified soft tissue disorders: Secondary | ICD-10-CM | POA: Insufficient documentation

## 2018-10-01 DIAGNOSIS — B1920 Unspecified viral hepatitis C without hepatic coma: Secondary | ICD-10-CM | POA: Insufficient documentation

## 2018-10-01 DIAGNOSIS — F17210 Nicotine dependence, cigarettes, uncomplicated: Secondary | ICD-10-CM | POA: Insufficient documentation

## 2018-10-01 DIAGNOSIS — O98411 Viral hepatitis complicating pregnancy, first trimester: Secondary | ICD-10-CM | POA: Insufficient documentation

## 2018-10-01 DIAGNOSIS — O209 Hemorrhage in early pregnancy, unspecified: Secondary | ICD-10-CM

## 2018-10-01 DIAGNOSIS — O3680X0 Pregnancy with inconclusive fetal viability, not applicable or unspecified: Secondary | ICD-10-CM

## 2018-10-01 HISTORY — DX: Fibromyalgia: M79.7

## 2018-10-01 LAB — POCT PREGNANCY, URINE: Preg Test, Ur: POSITIVE — AB

## 2018-10-01 NOTE — MAU Note (Signed)
Pt had positive pregnancy test. Started swelling yesterday feeling like she is dizzy. Also c/o some spotting as well. Mild cramping felt

## 2018-10-02 ENCOUNTER — Encounter (HOSPITAL_COMMUNITY): Payer: Self-pay | Admitting: *Deleted

## 2018-10-02 ENCOUNTER — Inpatient Hospital Stay (HOSPITAL_COMMUNITY): Payer: Self-pay

## 2018-10-02 DIAGNOSIS — O208 Other hemorrhage in early pregnancy: Secondary | ICD-10-CM

## 2018-10-02 DIAGNOSIS — O3680X0 Pregnancy with inconclusive fetal viability, not applicable or unspecified: Secondary | ICD-10-CM

## 2018-10-02 DIAGNOSIS — Z3A08 8 weeks gestation of pregnancy: Secondary | ICD-10-CM

## 2018-10-02 LAB — URINALYSIS, ROUTINE W REFLEX MICROSCOPIC
Bilirubin Urine: NEGATIVE
Glucose, UA: NEGATIVE mg/dL
Hgb urine dipstick: NEGATIVE
Ketones, ur: NEGATIVE mg/dL
Nitrite: POSITIVE — AB
Protein, ur: NEGATIVE mg/dL
Specific Gravity, Urine: 1.02 (ref 1.005–1.030)
pH: 5 (ref 5.0–8.0)

## 2018-10-02 LAB — WET PREP, GENITAL
Clue Cells Wet Prep HPF POC: NONE SEEN
Sperm: NONE SEEN
Trich, Wet Prep: NONE SEEN
Yeast Wet Prep HPF POC: NONE SEEN

## 2018-10-02 LAB — CBC
HCT: 33 % — ABNORMAL LOW (ref 36.0–46.0)
Hemoglobin: 11.2 g/dL — ABNORMAL LOW (ref 12.0–15.0)
MCH: 33.5 pg (ref 26.0–34.0)
MCHC: 33.9 g/dL (ref 30.0–36.0)
MCV: 98.8 fL (ref 80.0–100.0)
Platelets: 179 K/uL (ref 150–400)
RBC: 3.34 MIL/uL — ABNORMAL LOW (ref 3.87–5.11)
RDW: 13 % (ref 11.5–15.5)
WBC: 6.5 K/uL (ref 4.0–10.5)
nRBC: 0 % (ref 0.0–0.2)

## 2018-10-02 LAB — TYPE AND SCREEN
ABO/RH(D): A POS
Antibody Screen: NEGATIVE

## 2018-10-02 LAB — ABO/RH: ABO/RH(D): A POS

## 2018-10-02 LAB — HCG, QUANTITATIVE, PREGNANCY: hCG, Beta Chain, Quant, S: 352 m[IU]/mL — ABNORMAL HIGH (ref <5)

## 2018-10-02 MED ORDER — NITROFURANTOIN MONOHYD MACRO 100 MG PO CAPS: 100.0000 mg | ORAL_CAPSULE | Freq: Two times a day (BID) | ORAL | 0 refills | Status: DC

## 2018-10-02 NOTE — MAU Provider Note (Signed)
History    CSN: [id number] Arrival date and time: 10/01/18 2216 First Provider Initiated Contact with Patient 10/02/18 0131    Chief Complaint  Patient presents with  . Vaginal Bleeding  . Arm Swelling   HPI Crystal Ayala is a 33yo [id number] at [redacted]w[redacted]d by LMP who presents with a variety of complaints including vaginal spotting, UE/LE swelling and lightheadedness. States she primarily came to be evaluated because read online that swelling could be sign of miscarriage. She recently took a positive pregnancy test 5/19 and found it to be positive. Does have history of irregular periods. She states she noticed the swelling yesterday in her hands and legs. She also has a history of preeclampsia in a prior pregnancy and knew this could be a sign of that as well. States one episode of brownish discharge with wiping yesterday but otherwise hasn't noticed anymore vaginal bleeding. Denies any pelvic pain or cramping. Denies burning or pain with urination. Reports normal BM, no N/V.  OB History    Gravida  7   Para  3   Term  1   Preterm  2   AB  3   Living  3     SAB  3   TAB      Ectopic      Multiple      Live Births  3           Past Medical History:  Diagnosis Date  . Anxiety   . COPD (chronic obstructive pulmonary disease) (HCC)   . Fibromyalgia   . Hepatitis C   . Neuropathy   . Personality disorder [facility])     Past Surgical History:  Procedure Laterality Date  . TONSILLECTOMY      History reviewed. No pertinent family history.  Social History   Tobacco Use  . Smoking status: Current Every Day Smoker    Packs/day: 0.50    Types: Cigarettes  . Smokeless tobacco: Never Used  Substance Use Topics  . Alcohol use: Never    Frequency: Never  . Drug use: Not Currently    Types: Amphetamines, Heroin    Comment: on Suboxin    Allergies:  Allergies  Allergen Reactions  . Penicillins Anaphylaxis    Has patient had a PCN reaction causing immediate rash,  facial/tongue/throat swelling, SOB or lightheadedness with hypotension: yes Has patient had a PCN reaction causing severe rash involving mucus membranes or skin necrosis: No Has patient had a PCN reaction that required hospitalization: No Has patient had a PCN reaction occurring within the last 10 years: No If all of the above answers are "NO", then may proceed with Cephalosporin use.    Orbie Hurst [Buspirone] Other (See Comments)    Night terrors  . Cetirizine & Related Nausea And Vomiting    Patient can take allegra  . Tape Other (See Comments)    Tears skin   . Latex Rash    No medications prior to admission.    Review of Systems  Constitutional: Positive for fatigue. Negative for activity change, appetite change and fever.  HENT: Negative for congestion.   Eyes: Negative for visual disturbance.  Respiratory: Negative for shortness of breath.   Cardiovascular: Positive for leg swelling. Negative for palpitations.  Gastrointestinal: Negative for nausea and vomiting.  Genitourinary: Negative for dysuria.  Musculoskeletal: Negative for back pain.  Neurological: Positive for light-headedness and headaches. Negative for dizziness.  Psychiatric/Behavioral: The patient is nervous/anxious.    Physical Exam   Blood pressure 140/77,  pulse 100, temperature 97.9 F (36.6 C), temperature source Oral, resp. rate 18, weight 126.1 kg, last menstrual period 08/07/2018, SpO2 96 %.  Physical Exam  Nursing note and vitals reviewed. Constitutional: She is oriented to person, place, and time. She appears well-developed and well-nourished. No distress.  Sleeping on entry into room  HENT:  Head: Normocephalic and atraumatic.  Eyes: Conjunctivae and EOM are normal. No scleral icterus.  Cardiovascular: Normal rate, regular rhythm, normal heart sounds and intact distal pulses.  No murmur heard. Respiratory: Effort normal and breath sounds normal. She has no wheezes.  GI: Soft. There is no  abdominal tenderness. There is no guarding.  obese  Genitourinary:    Genitourinary Comments: Normal appearing external labia  normal vaginal mucosa, scant vaginal discharge, no blood in vault  cervical ectropion noted, no lesions or bleeding, cervix visually closed   Musculoskeletal:        General: Edema (trace b/l LE) present.  Neurological: She is alert and oriented to person, place, and time. No cranial nerve deficit.  Skin: Skin is warm and dry. No rash noted.  Psychiatric: She has a normal mood and affect. Her behavior is normal.   MAU Course  Procedures  MDM -- triaged VB in early pregnancy - ordered quant hCG, CBC, T&S, TVUS to confirm pregnancy location  -- U/A with evidence of UTI - nitrites, leukocytes, bacteria, will treat and send OB urine culture -- quant 352, no IUP visualized on U/S, will need repeat quant in 48 hours  -- A+ - no indication for rhogam  -- blood pressure wnl, no signs of excessive swelling on exam - discussed may be related to increased heat over last couple of days, advised hydration, regular physical activity, avoiding excessive salt/processed foods   Results for orders placed or performed during the hospital encounter of 10/01/18 (from the past 48 hour(s))  Pregnancy, urine POC     Status: Abnormal   Collection Time: 10/01/18 11:42 PM  Result Value Ref Range   Preg Test, Ur POSITIVE (A) NEGATIVE    Comment:        THE SENSITIVITY OF THIS METHODOLOGY IS >24 mIU/mL   Urinalysis, Routine w reflex microscopic     Status: Abnormal   Collection Time: 10/01/18 11:45 PM  Result Value Ref Range   Color, Urine YELLOW YELLOW   APPearance CLOUDY (A) CLEAR   Specific Gravity, Urine 1.020 1.005 - 1.030   pH 5.0 5.0 - 8.0   Glucose, UA NEGATIVE NEGATIVE mg/dL   Hgb urine dipstick NEGATIVE NEGATIVE   Bilirubin Urine NEGATIVE NEGATIVE   Ketones, ur NEGATIVE NEGATIVE mg/dL   Protein, ur NEGATIVE NEGATIVE mg/dL   Nitrite POSITIVE (A) NEGATIVE    Leukocytes,Ua MODERATE (A) NEGATIVE   RBC / HPF 0-5 0 - 5 RBC/hpf   WBC, UA 21-50 0 - 5 WBC/hpf   Bacteria, UA FEW (A) NONE SEEN   Squamous Epithelial / LPF 11-20 0 - 5   Mucus PRESENT    Ca Oxalate Crys, UA PRESENT     Comment: Performed at [facility], [address], [place], [place] [address]  Type and screen [facility]     Status: None (Preliminary result)   Collection Time: 10/02/18 12:58 AM  Result Value Ref Range   ABO/RH(D) A POS    Antibody Screen NEG    Sample Expiration      10/05/2018,2359 Performed at [facility], [address], [place], [place] [address]  CBC     Status: Abnormal   Collection Time: 10/02/18 12:58 AM  Result Value Ref Range   WBC 6.5 4.0 - 10.5 K/uL   RBC 3.34 (L) 3.87 - 5.11 MIL/uL   Hemoglobin 11.2 (L) 12.0 - 15.0 g/dL   HCT 16.133.0 (L) 09.636.0 - 04.546.0 %   MCV 98.8 80.0 - 100.0 fL   MCH 33.5 26.0 - 34.0 pg   MCHC 33.9 30.0 - 36.0 g/dL   RDW 40.913.0 81.111.5 - 91.415.5 %   Platelets 179 150 - 400 K/uL   nRBC 0.0 0.0 - 0.2 %    Comment: Performed at [facility], [address] [place], [place] [address]  hCG, quantitative, pregnancy     Status: Abnormal   Collection Time: 10/02/18 12:58 AM  Result Value Ref Range   hCG, Beta Chain, Quant, S 352 (H) <5 mIU/mL    Comment:          GEST. AGE      CONC.  (mIU/mL)   <=1 WEEK        5 - 50     2 WEEKS       50 - 500     3 WEEKS       100 - 10,000     4 WEEKS     1,000 - 30,000     5 WEEKS     3,500 - 115,000   6-8 WEEKS     12,000 - 270,000    12 WEEKS     15,000 - 220,000        FEMALE AND NON-PREGNANT FEMALE:     LESS THAN 5 mIU/mL Performed at [facility], [address] [place], [place] [address]   ABO/Rh     Status: None   Collection Time: 10/02/18 12:58 AM  Result Value Ref Range   ABO/RH(D)      A POS Performed at [facility], [address] [place], [place] [address]   Wet prep, genital     Status: Abnormal   Collection Time: 10/02/18  2:27  AM  Result Value Ref Range   Yeast Wet Prep HPF POC NONE SEEN NONE SEEN   Trich, Wet Prep NONE SEEN NONE SEEN   Clue Cells Wet Prep HPF POC NONE SEEN NONE SEEN   WBC, Wet Prep HPF POC MANY (A) NONE SEEN   Sperm NONE SEEN     Comment: Performed at [facility], [address] [place], [place] [address]   US OB LESS THAN 14 WEEKS WITH OB TRANSVAGINAL CLINICAL DATA:  Spotting.  First trimester pregnancy  EXAM: OBSTETRIC <14 WK US AND TRANSVAGINAL OB US  TECHNIQUE: Both transabdominal and transvaginal ultrasound examinations were performed for complete evaluation of the gestation as well as the maternal uterus, adnexal regions, and pelvic cul-de-sac. Transvaginal technique was performed to assess early pregnancy.  COMPARISON:  None.  FINDINGS: Intrauterine gestational sac: None  Yolk sac:  Not Visualized.  Embryo:  Not Visualized.  Cardiac Activity: Not Visualized.  Subchorionic hemorrhage:  None visualized.  Maternal uterus/adnexae: Bilateral corpus luteal cysts are noted. No ovarian mass concerning for an ectopic pregnancy. Nabothian cysts are noted.  IMPRESSION: No IUP is visualized.  By definition, in the setting of a positive pregnancy test, this reflects a pregnancy of unknown location. Differential considerations include early normal IUP, abnormal IUP/missed abortion, or nonvisualized ectopic pregnancy.  Serial beta HCG is suggested. Consider repeat pelvic ultrasound in  14 days.  Electronically Signed   By: Katherine Mantle M.D.   On: 10/02/2018 02:22  Assessment and Plan  32yo [id number] at [redacted]w[redacted]d by LMP but with quant 352 suggesting either earlier pregnancy, recent miscarriage, and/or possible extrauterine pregnancy. Reviewed results with patient and recommended returning for repeat quant hCG in about 48 hours - she is in agreement. Also discussed that pending hormone results, would also have repeat U/S in about 2 weeks. Discussed treatment of probable  asymptomatic bacteriuria, will follow-up OB urine culture. Sent Rx for [employer] to pharmacy on file. She voiced understanding. Reviewed return precautions, all questions answered prior to discharge.   Crystal Stands, DO 10/02/2018, 3:59 AM

## 2018-10-02 NOTE — Discharge Instructions (Signed)
Vaginal Bleeding During Pregnancy, First Trimester    A small amount of bleeding (spotting) from the vagina is common during early pregnancy. Sometimes the bleeding is normal and does not cause problems. At other times, though, bleeding may be a sign of something serious. Tell your doctor about any bleeding from your vagina right away.  Follow these instructions at home:  Activity  · Follow your doctor's instructions about how active you can be.  · If needed, make plans for someone to help with your normal activities.  · Do not have sex or orgasms until your doctor says that this is safe.  General instructions  · Take over-the-counter and prescription medicines only as told by your doctor.  · Watch your condition for any changes.  · Write down:  ? The number of pads you use each day.  ? How often you change pads.  ? How soaked (saturated) your pads are.  · Do not use tampons.  · Do not douche.  · If you pass any tissue from your vagina, save it to show to your doctor.  · Keep all follow-up visits as told by your doctor. This is important.  Contact a doctor if:  · You have vaginal bleeding at any time while you are pregnant.  · You have cramps.  · You have a fever.  Get help right away if:  · You have very bad cramps in your back or belly (abdomen).  · You pass large clots or a lot of tissue from your vagina.  · Your bleeding gets worse.  · You feel light-headed.  · You feel weak.  · You pass out (faint).  · You have chills.  · You are leaking fluid from your vagina.  · You have a gush of fluid from your vagina.  Summary  · Sometimes vaginal bleeding during pregnancy is normal and does not cause problems. At other times, bleeding may be a sign of something serious.  · Tell your doctor about any bleeding from your vagina right away.  · Follow your doctor's instructions about how active you can be. You may need someone to help you with your normal activities.  This information is not intended to replace advice given to  you by your health care provider. Make sure you discuss any questions you have with your health care provider.  Document Released: 09/11/2013 Document Revised: 07/29/2016 Document Reviewed: 07/29/2016  Elsevier Interactive Patient Education © 2019 [employer] Inc[employer]

## 2018-10-03 ENCOUNTER — Inpatient Hospital Stay (HOSPITAL_COMMUNITY)
Admission: AD | Admit: 2018-10-03 | Discharge: 2018-10-03 | Disposition: A | Discharge status: Home / Self Care | Payer: Self-pay | Attending: Obstetrics & Gynecology | Admitting: Obstetrics & Gynecology

## 2018-10-03 DIAGNOSIS — Z3A08 8 weeks gestation of pregnancy: Secondary | ICD-10-CM | POA: Insufficient documentation

## 2018-10-03 DIAGNOSIS — O3680X0 Pregnancy with inconclusive fetal viability, not applicable or unspecified: Secondary | ICD-10-CM | POA: Insufficient documentation

## 2018-10-03 LAB — HCG, QUANTITATIVE, PREGNANCY: hCG, Beta Chain, Quant, S: 731 m[IU]/mL — ABNORMAL HIGH (ref <5)

## 2018-10-03 NOTE — MAU Note (Signed)
States she is just here for a repeat quant.  No VB or pain.

## 2018-10-03 NOTE — MAU Provider Note (Signed)
Ms. Crystal Ayala  is a 33 y.o. [phone]  at [redacted]w[redacted]d who presents to MAU today for follow-up quant hCG. The patient denies abdominal pain, vaginal bleeding, N/V or fever.   BP 131/79   Pulse 82   Temp 98.6 F (37 C)   Resp 20   LMP 08/07/2018 (Within Weeks)   SpO2 100%   GENERAL: Well-developed, well-nourished female in no acute distress.  HEENT: Normocephalic, atraumatic.   LUNGS: Effort normal HEART: Regular rate  SKIN: Warm, dry and without erythema PSYCH: Normal mood and affect   A: Appropriate rise in quant hCG after 48 hours  P: Discharge home Bleeding/Ectopic precautions discussed Will get 1 additional HCG on Thursday; if continues to rise appropriately will schedule outpatient u/s Patient may return to MAU as needed or if her condition were to change or worsen   Judeth Horn, NP  10/03/2018 10:22 PM

## 2018-10-04 LAB — CULTURE, OB URINE: Culture: >=100000 — AB

## 2018-10-06 ENCOUNTER — Other Ambulatory Visit: Payer: Self-pay

## 2018-10-06 ENCOUNTER — Telehealth: Payer: Self-pay | Admitting: General Practice

## 2018-10-06 ENCOUNTER — Ambulatory Visit (INDEPENDENT_AMBULATORY_CARE_PROVIDER_SITE_OTHER): Payer: Self-pay

## 2018-10-06 DIAGNOSIS — O3680X0 Pregnancy with inconclusive fetal viability, not applicable or unspecified: Secondary | ICD-10-CM

## 2018-10-06 LAB — BETA HCG QUANT (REF LAB): hCG Quant: 1561 m[IU]/mL

## 2018-10-06 NOTE — Telephone Encounter (Signed)
Called patient with bhcg results, discussed ultrasound appt/date, & ectopic precautions reviewed. Patient verbalized understanding to all & had no questions.

## 2018-10-06 NOTE — Progress Notes (Signed)
Reviewed results with Dr Alysia Penna who finds appropriate rise in bhcg levels- patient should have follow up ultrasound in 10-14 days from initial. Scheduled 6/9 @ 8am. Will call patient with results/appt.  Chase Caller RN BSN 10/06/18

## 2018-10-06 NOTE — Progress Notes (Signed)
Patient ID: Crystal Ayala, female   DOB: 02-24-1986, 33 y.o.   MRN: [id number]  Pt here today for STAT Beta Lab.  Pt denies any pain or bleeding.  Pt informed that her results will take approximately two hours to return.  And I will call her with results and f/u.  Pt stated understanding.   Received call from [facility] that stat beta results are 1561.  Notified Dr. Alysia Penna for advisement.

## 2018-10-07 NOTE — Progress Notes (Signed)
Agree with A & P. 

## 2018-10-12 ENCOUNTER — Inpatient Hospital Stay (HOSPITAL_COMMUNITY): Payer: Self-pay

## 2018-10-12 ENCOUNTER — Inpatient Hospital Stay (HOSPITAL_COMMUNITY)
Admission: AD | Admit: 2018-10-12 | Discharge: 2018-10-13 | Disposition: A | Discharge status: Home / Self Care | Payer: Self-pay | Attending: Obstetrics & Gynecology | Admitting: Obstetrics & Gynecology

## 2018-10-12 ENCOUNTER — Telehealth (INDEPENDENT_AMBULATORY_CARE_PROVIDER_SITE_OTHER): Payer: Self-pay | Admitting: Nurse Practitioner

## 2018-10-12 ENCOUNTER — Encounter (HOSPITAL_COMMUNITY): Payer: Self-pay | Admitting: *Deleted

## 2018-10-12 ENCOUNTER — Other Ambulatory Visit: Payer: Self-pay

## 2018-10-12 DIAGNOSIS — F17210 Nicotine dependence, cigarettes, uncomplicated: Secondary | ICD-10-CM | POA: Insufficient documentation

## 2018-10-12 DIAGNOSIS — Z880 Allergy status to penicillin: Secondary | ICD-10-CM | POA: Insufficient documentation

## 2018-10-12 DIAGNOSIS — O99331 Smoking (tobacco) complicating pregnancy, first trimester: Secondary | ICD-10-CM | POA: Insufficient documentation

## 2018-10-12 DIAGNOSIS — O418X10 Other specified disorders of amniotic fluid and membranes, first trimester, not applicable or unspecified: Secondary | ICD-10-CM

## 2018-10-12 DIAGNOSIS — O4690 Antepartum hemorrhage, unspecified, unspecified trimester: Secondary | ICD-10-CM

## 2018-10-12 DIAGNOSIS — Z3A09 9 weeks gestation of pregnancy: Secondary | ICD-10-CM | POA: Insufficient documentation

## 2018-10-12 DIAGNOSIS — O209 Hemorrhage in early pregnancy, unspecified: Secondary | ICD-10-CM | POA: Insufficient documentation

## 2018-10-12 LAB — URINALYSIS, ROUTINE W REFLEX MICROSCOPIC
Bilirubin Urine: NEGATIVE
Glucose, UA: NEGATIVE mg/dL
Ketones, ur: NEGATIVE mg/dL
Nitrite: POSITIVE — AB
Protein, ur: NEGATIVE mg/dL
Specific Gravity, Urine: 1.017 (ref 1.005–1.030)
pH: 5 (ref 5.0–8.0)

## 2018-10-12 NOTE — MAU Note (Signed)
Pt reports having vag bleeding off and on since the 28th of May. She says she never fills a full sized pad but called her Dr. And was told not to come in to MAU unless she filled 2 pads an hour and advised her not to lift any item over 10lbs. Denies any pain or  bleeding today.

## 2018-10-12 NOTE — Telephone Encounter (Signed)
The patient stated she would like to make an appointment to start prenatal care. She visited the ER recently. She stated she has been bleeding since Friday. Advised of the MAU however the patient stated she was visited on the 28th and she would like to speak with someone.

## 2018-10-12 NOTE — MAU Provider Note (Signed)
Chief Complaint: Vaginal Bleeding   First Provider Initiated Contact with Patient 10/12/18 2253     SUBJECTIVE HPI: Crystal Ayala is a 33 y.o. [id number] at 4573w3d who presents to [facility] reporting vaginal bleeding. Called office this morning & was instructed to come to MAU. Reports intermittent bright red blood when she wipes. Denies abdominal pain. No recent intercourse. Had ultrasound on 5/24 that didn't show an IUP. HCGs have been followed & risen appropriately & she is scheduled for an outpatient ultrasound on 6/9.    Past Medical History:  Diagnosis Date  . Anxiety   . COPD (chronic obstructive pulmonary disease) (HCC)   . Fibromyalgia   . Hepatitis C   . Neuropathy   . Personality disorder (HCC)    OB History  Gravida Para Term Preterm AB Living  7 3 1 2 3 3   SAB TAB Ectopic Multiple Live Births  3       3    # Outcome Date GA Lbr Len/2nd Weight Sex Delivery Anes PTL Lv  7 Current           6 SAB           5 SAB           4 SAB           3 Term  2327w0d    Vag-Spont   LIV  2 Preterm  4322w0d       LIV  1 Preterm  122w0d       LIV   Past Surgical History:  Procedure Laterality Date  . TONSILLECTOMY    . WISDOM TOOTH EXTRACTION     Social History   Socioeconomic History  . Marital status: Legally Separated    Spouse name: Not on file  . Number of children: Not on file  . Years of education: Not on file  . Highest education level: Not on file  Occupational History  . Not on file  Social Needs  . Financial resource strain: Not on file  . Food insecurity:    Worry: Not on file    Inability: Not on file  . Transportation needs:    Medical: Not on file    Non-medical: Not on file  Tobacco Use  . Smoking status: Current Every Day Smoker    Packs/day: 0.50    Types: Cigarettes  . Smokeless tobacco: Never Used  Substance and Sexual Activity  . Alcohol use: Not Currently    Frequency: Never    Comment: not since pregnancy  . Drug use: Not Currently   Types: Amphetamines, Heroin    Comment: on Suboxin, no heroin for one year. no amphetamines for  6 months   . Sexual activity: Yes  Lifestyle  . Physical activity:    Days per week: Not on file    Minutes per session: Not on file  . Stress: Not on file  Relationships  . Social connections:    Talks on phone: Not on file    Gets together: Not on file    Attends religious service: Not on file    Active member of club or organization: Not on file    Attends meetings of clubs or organizations: Not on file    Relationship status: Not on file  . Intimate partner violence:    Fear of current or ex partner: Not on file    Emotionally abused: Not on file    Physically abused: Not on file    Forced sexual  activity: Not on file  Other Topics Concern  . Not on file  Social History Narrative  . Not on file   History reviewed. No pertinent family history. No current facility-administered medications on file prior to encounter.    Current Outpatient Medications on File Prior to Encounter  Medication Sig Dispense Refill  . buprenorphine-naloxone (SUBOXONE) 2-0.5 mg SUBL SL tablet Place 1 tablet under the tongue daily.    . Prenatal Vit-Fe Fumarate-FA (PRENATAL MULTIVITAMIN) TABS tablet Take 1 tablet by mouth daily at 12 noon.    . Melatonin 10 MG TABS Take 20 mg by mouth at bedtime.    . nitrofurantoin, macrocrystal-monohydrate, (MACROBID) 100 MG capsule Take 1 capsule (100 mg total) by mouth 2 (two) times daily. 10 capsule 0   Allergies  Allergen Reactions  . Penicillins Anaphylaxis    Has patient had a PCN reaction causing immediate rash, facial/tongue/throat swelling, SOB or lightheadedness with hypotension: yes Has patient had a PCN reaction causing severe rash involving mucus membranes or skin necrosis: No Has patient had a PCN reaction that required hospitalization: No Has patient had a PCN reaction occurring within the last 10 years: No If all of the above answers are "NO", then may  proceed with Cephalosporin use.    Orbie Hurst [Buspirone] Other (See Comments)    Night terrors  . Cetirizine & Related Nausea And Vomiting    Patient can take allegra  . Tape Other (See Comments)    Tears skin   . Latex Rash    I have reviewed patient's Past Medical Hx, Surgical Hx, Family Hx, Social Hx, medications and allergies.   Review of Systems  Constitutional: Negative.   Gastrointestinal: Negative.   Genitourinary: Positive for vaginal bleeding.    OBJECTIVE Patient Vitals for the past 24 hrs:  BP Temp Pulse Resp Height Weight  10/13/18 0022 129/72 - 84 16 - -  10/12/18 2246 111/62 98.4 F (36.9 C) 75 16 5\' 2"  (1.575 m) 125.6 kg  10/12/18 2218 - - - - - 125.7 kg   Constitutional: Well-developed, well-nourished female in no acute distress.  Cardiovascular: normal rate & rhythm, no murmur Respiratory: normal rate and effort. Lung sounds clear throughout GI: Abd soft, non-tender, Pos BS x 4. No guarding or rebound tenderness MS: Extremities nontender, no edema, normal ROM Neurologic: Alert and oriented x 4.      LAB RESULTS Results for orders placed or performed during the hospital encounter of 10/12/18 (from the past 24 hour(s))  Urinalysis, Routine w reflex microscopic     Status: Abnormal   Collection Time: 10/12/18 11:41 PM  Result Value Ref Range   Color, Urine YELLOW YELLOW   APPearance HAZY (A) CLEAR   Specific Gravity, Urine 1.017 1.005 - 1.030   pH 5.0 5.0 - 8.0   Glucose, UA NEGATIVE NEGATIVE mg/dL   Hgb urine dipstick MODERATE (A) NEGATIVE   Bilirubin Urine NEGATIVE NEGATIVE   Ketones, ur NEGATIVE NEGATIVE mg/dL   Protein, ur NEGATIVE NEGATIVE mg/dL   Nitrite POSITIVE (A) NEGATIVE   Leukocytes,Ua TRACE (A) NEGATIVE   RBC / HPF 0-5 0 - 5 RBC/hpf   WBC, UA 0-5 0 - 5 WBC/hpf   Bacteria, UA MANY (A) NONE SEEN   Squamous Epithelial / LPF 0-5 0 - 5   Mucus PRESENT    Ca Oxalate Crys, UA PRESENT     IMAGING US Ob Transvaginal  Result Date:  10/12/2018 CLINICAL DATA:  33 year old pregnant female with vaginal bleeding. LMP: 08/07/2018  corresponding to an estimated gestational age of [redacted] weeks, 3 days. EXAM: OBSTETRIC <14 WK ULTRASOUND TRIPLET COMPARISON:  None. FINDINGS: Number of IUPs: There are 3 intrauterine cystic structures none. No fetal pole or yolk sac identified. These cystic structures may represent early gestational sacs, endometrial cysts, or blighted ovum. Clinical correlation and follow-up with ultrasound in 7-10 days, or earlier if clinically indicated, recommended. TRIPLET 1 Yolk sac:  Not Visualized. Embryo:  Not Visualized. MSD:  3.1 mm corresponding to 5 w 0d TRIPLET 2 Yolk sac:  Not Visualized. Embryo:  Not Visualized. MSD: 6.6 mm corresponding to 5w 2d TRIPLET 3 Yolk sac:  Not Visualized. Embryo:  Not Visualized. MSD: 5 mm corresponding to 5w 1d Subchorionic hemorrhage: Moderate subchorionic hemorrhage measuring 2.1 x 0.8 x 2.6 cm Maternal uterus/adnexae: The maternal ovaries appear unremarkable. Multiple nabothian cysts noted in the lower uterus. IMPRESSION: Three cystic structures within the endometrium as described may represent early gestational sacs. No fetal pole or yolk sac identified at this time. Clinical correlation and follow-up with HCG levels and ultrasound in 7-10 days, or earlier if clinically indicated, recommended. Electronically Signed   By: Elgie Collard M.D.   On: 10/12/2018 23:55    MAU COURSE Orders Placed This Encounter  Procedures  . Culture, OB Urine  . US OB Transvaginal  . Urinalysis, Routine w reflex microscopic  . Discharge patient   No orders of the defined types were placed in this encounter.   MDM RH positive  Ultrasound shows 3 possible IUGS, no YS. Moderate SCH. Discussed results with patient. Pt to keep scheduled outpatient ultrasound in 1 week.   U/a with + nitrites. Pt prescribed macrobid 5/24 (urine culture shows sensitive). Has not picked up rx due to cost. Asymptomatic &  afebrile. Other abx same cost so unable to switch out. Patient states she will get rx filled as soon as she can.   ASSESSMENT 1. Subchorionic hematoma in first trimester, single or unspecified fetus   2. Vaginal bleeding in pregnancy, first trimester     PLAN Discharge home in stable condition. Bleeding precautions Keep scheduled ultrasound  Allergies as of 10/13/2018      Reactions   Penicillins Anaphylaxis   Has patient had a PCN reaction causing immediate rash, facial/tongue/throat swelling, SOB or lightheadedness with hypotension: yes Has patient had a PCN reaction causing severe rash involving mucus membranes or skin necrosis: No Has patient had a PCN reaction that required hospitalization: No Has patient had a PCN reaction occurring within the last 10 years: No If all of the above answers are "NO", then may proceed with Cephalosporin use.   Buspar [buspirone] Other (See Comments)   Night terrors   Cetirizine & Related Nausea And Vomiting   Patient can take allegra   Tape Other (See Comments)   Tears skin   Latex Rash      Medication List    TAKE these medications   buprenorphine-naloxone 2-0.5 mg Subl SL tablet Commonly known as:  SUBOXONE Place 1 tablet under the tongue daily.   Melatonin 10 MG Tabs Take 20 mg by mouth at bedtime.   nitrofurantoin (macrocrystal-monohydrate) 100 MG capsule Commonly known as:  Macrobid Take 1 capsule (100 mg total) by mouth 2 (two) times daily.   prenatal multivitamin Tabs tablet Take 1 tablet by mouth daily at 12 noon.        Judeth Horn, NP 10/13/2018  12:27 AM

## 2018-10-12 NOTE — Telephone Encounter (Signed)
Returned pt's call and advised her that we advise that she go to MAU d/t bleeding in pregnancy.  Pt verbalized understanding and stated she would go.

## 2018-10-13 DIAGNOSIS — O418X10 Other specified disorders of amniotic fluid and membranes, first trimester, not applicable or unspecified: Secondary | ICD-10-CM

## 2018-10-13 DIAGNOSIS — O468X1 Other antepartum hemorrhage, first trimester: Secondary | ICD-10-CM

## 2018-10-13 DIAGNOSIS — O209 Hemorrhage in early pregnancy, unspecified: Secondary | ICD-10-CM

## 2018-10-13 DIAGNOSIS — Z3A09 9 weeks gestation of pregnancy: Secondary | ICD-10-CM

## 2018-10-13 NOTE — Discharge Instructions (Signed)
Return to care   If you have heavier bleeding that soaks through more that 2 pads per hour for an hour or more  If you bleed so much that you feel like you might pass out or you do pass out  If you have significant abdominal pain that is not improved with Tylenol   If you develop a fever > 100.5    Subchorionic Hematoma  A subchorionic hematoma is a gathering of blood between the outer wall of the embryo (chorion) and the inner wall of the womb (uterus). This condition can cause vaginal bleeding. If they cause little or no vaginal bleeding, early small hematomas usually shrink on their own and do not affect your baby or pregnancy. When bleeding starts later in pregnancy, or if the hematoma is larger or occurs in older pregnant women, the condition may be more serious. Larger hematomas may get bigger, which increases the chances of miscarriage. This condition also increases the risk of:  Premature separation of the placenta from the uterus.  Premature (preterm) labor.  Stillbirth. What are the causes? The exact cause of this condition is not known. It occurs when blood is trapped between the placenta and the uterine wall because the placenta has separated from the original site of implantation. What increases the risk? You are more likely to develop this condition if:  You were treated with fertility medicines.  You conceived through in vitro fertilization (IVF). What are the signs or symptoms? Symptoms of this condition include:  Vaginal spotting or bleeding.  Contractions of the uterus. These cause abdominal pain. Sometimes you may have no symptoms and the bleeding may only be seen when ultrasound images are taken (transvaginal ultrasound). How is this diagnosed? This condition is diagnosed based on a physical exam. This includes a pelvic exam. You may also have other tests, including:  Blood tests.  Urine tests.  Ultrasound of the abdomen. How is this  treated? Treatment for this condition can vary. Treatment may include:  Watchful waiting. You will be monitored closely for any changes in bleeding. During this stage: ? The hematoma may be reabsorbed by the body. ? The hematoma may separate the fluid-filled space containing the embryo (gestational sac) from the wall of the womb (endometrium).  Medicines.  Activity restriction. This may be needed until the bleeding stops. Follow these instructions at home:  Stay on bed rest if told to do so by your health care provider.  Do not lift anything that is heavier than 10 lbs. (4.5 kg) or as told by your health care provider.  Do not use any products that contain nicotine or tobacco, such as cigarettes and e-cigarettes. If you need help quitting, ask your health care provider.  Track and write down the number of pads you use each day and how soaked (saturated) they are.  Do not use tampons.  Keep all follow-up visits as told by your health care provider. This is important. Your health care provider may ask you to have follow-up blood tests or ultrasound tests or both. Contact a health care provider if:  You have any vaginal bleeding.  You have a fever. Get help right away if:  You have severe cramps in your stomach, back, abdomen, or pelvis.  You pass large clots or tissue. Save any tissue for your health care provider to look at.  You have more vaginal bleeding, and you faint or become lightheaded or weak. Summary  A subchorionic hematoma is a gathering of blood between  the outer wall of the placenta and the uterus.  This condition can cause vaginal bleeding.  Sometimes you may have no symptoms and the bleeding may only be seen when ultrasound images are taken.  Treatment may include watchful waiting, medicines, or activity restriction. This information is not intended to replace advice given to you by your health care provider. Make sure you discuss any questions you have with  your health care provider. Document Released: 08/12/2006 Document Revised: 06/23/2016 Document Reviewed: 06/23/2016 Elsevier Interactive Patient Education  2019 [employer]

## 2018-10-14 ENCOUNTER — Telehealth: Payer: Self-pay | Admitting: Obstetrics & Gynecology

## 2018-10-14 LAB — CULTURE, OB URINE: Culture: 80000 — AB

## 2018-10-14 NOTE — Telephone Encounter (Signed)
Called the patient to confirm the virtual visit. The patient has the app download.

## 2018-10-17 ENCOUNTER — Telehealth: Payer: Self-pay

## 2018-10-18 ENCOUNTER — Ambulatory Visit (HOSPITAL_COMMUNITY)
Admission: RE | Admit: 2018-10-18 | Discharge: 2018-10-18 | Disposition: A | Discharge status: Home / Self Care | Payer: Self-pay | Source: Ambulatory Visit | Attending: Obstetrics and Gynecology | Admitting: Obstetrics and Gynecology

## 2018-10-18 ENCOUNTER — Other Ambulatory Visit: Payer: Self-pay

## 2018-10-18 DIAGNOSIS — O3680X0 Pregnancy with inconclusive fetal viability, not applicable or unspecified: Secondary | ICD-10-CM | POA: Insufficient documentation

## 2018-10-19 ENCOUNTER — Telehealth: Payer: Self-pay

## 2018-10-19 ENCOUNTER — Telehealth: Payer: Self-pay | Admitting: Obstetrics and Gynecology

## 2018-10-19 NOTE — Telephone Encounter (Addendum)
-----   Message from Chancy Milroy, MD sent at 10/19/2018  8:30 AM EDT ----- Please let pt know that it would appear that she has miscarried. BHCG, routine in 1 week Cancel new OB intake and new OB appt Thanks Legrand Como  Attempted to contact pt and was unable to LM due to VM box not set up.  MyChart message sent to pt to let us know a good time to give her a call.

## 2018-10-19 NOTE — Telephone Encounter (Signed)
Attempted to call patient about her new ob intake appointment on 6/11 @ 8:15. No answer and the number stated that the call could not be completed at this time. Was not able to go over process changes, inform that it is a mychart visit, or ensure that the [employer] app is downloaded before appointment.

## 2018-10-20 ENCOUNTER — Telehealth (INDEPENDENT_AMBULATORY_CARE_PROVIDER_SITE_OTHER): Payer: Self-pay | Admitting: *Deleted

## 2018-10-20 ENCOUNTER — Telehealth: Payer: Self-pay | Admitting: *Deleted

## 2018-10-20 ENCOUNTER — Other Ambulatory Visit: Payer: Self-pay

## 2018-10-20 DIAGNOSIS — O0990 Supervision of high risk pregnancy, unspecified, unspecified trimester: Secondary | ICD-10-CM

## 2018-10-20 DIAGNOSIS — O039 Complete or unspecified spontaneous abortion without complication: Secondary | ICD-10-CM

## 2018-10-20 NOTE — Telephone Encounter (Signed)
Called pt and I asked pt if she understood what is going on with her pregnancy.  Pt understood that she was "no longer pregnant".  I asked pt if she was okay.  Pt stated that she was fine and did not have any other questions comments or concerns at this time.

## 2018-10-20 NOTE — Telephone Encounter (Signed)
Pt left voicemail on nurse line stating that she lost her babies and she is bleeding a lot.  Pt states she wants to know how long she should be bleeding or what she should look for.  Returned pt's call regarding her bleeding.  Pt states she has noticed she has had heavy bleeding.  Pt states she is changing a pad 5-6 times a day.  Pt states it has been occurring since she went to MAU.  Pt states she is just wondering how long the bleeding should last.  Advised pt that her bleeding could last 2-3 weeks following her miscarriage and that there was nothing special that she needed to do.  Advised her that if she was saturating more than 1 pad an hour for several hours or passing golf ball sized or larger clots, she would need to go to MAU.  Pt verbalized understanding.

## 2018-10-20 NOTE — Progress Notes (Signed)
Pt scheduled for New Ob intake however per chart review, pt had visit to MAU on 6/3 due to vaginal bleeding. Follow up US on 6/9 shows absence of IUP. Attempt to contact pt on 6/9 by Verdell Carmine, RN to discuss results and plan of care was unsuccessful. Clinical staff will continue to reach out to pt regarding plan of care.

## 2018-10-31 ENCOUNTER — Encounter: Payer: Self-pay | Admitting: Obstetrics and Gynecology

## 2020-02-16 IMAGING — US TRANSVAGINAL OB ULTRASOUND
1 series · 15 of 28 positions shown · non-contrast
Comparison: None.

CLINICAL DATA: 32-year-old pregnant female with vaginal bleeding.
LMP: 08/07/2018 corresponding to an estimated gestational age of 9
weeks, 3 days.

EXAM:
OBSTETRIC <14 WK ULTRASOUND TRIPLET

[Series 1: transvaginal ob ultrasound · 48 acquisitions, 15 frames shown]
[im 1/48]
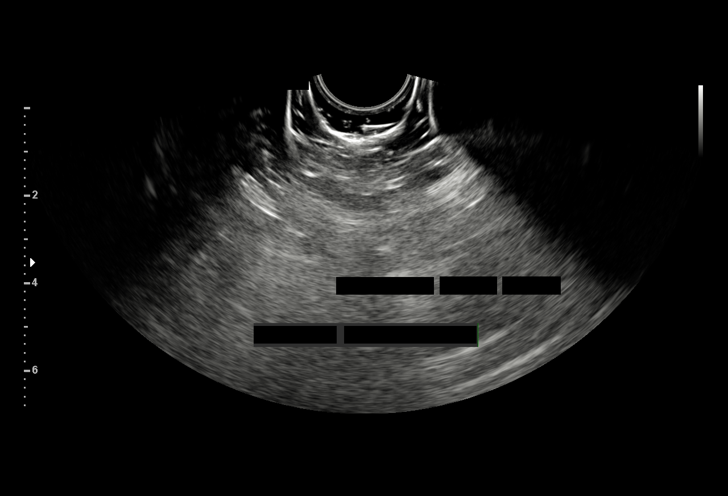
[im 4/48]
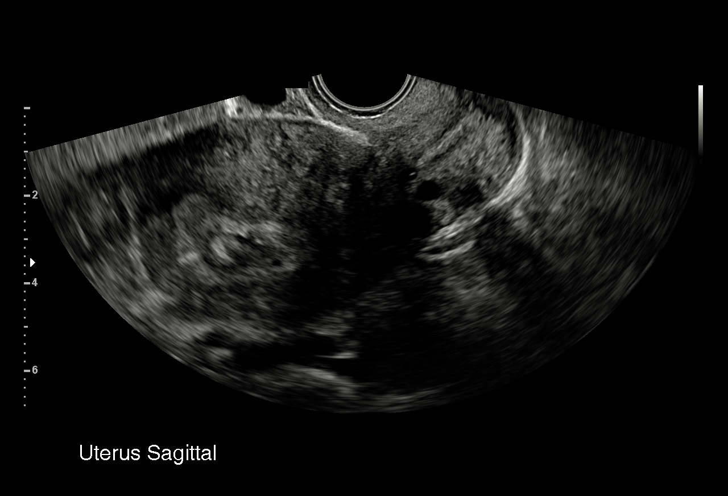
[im 7/48]
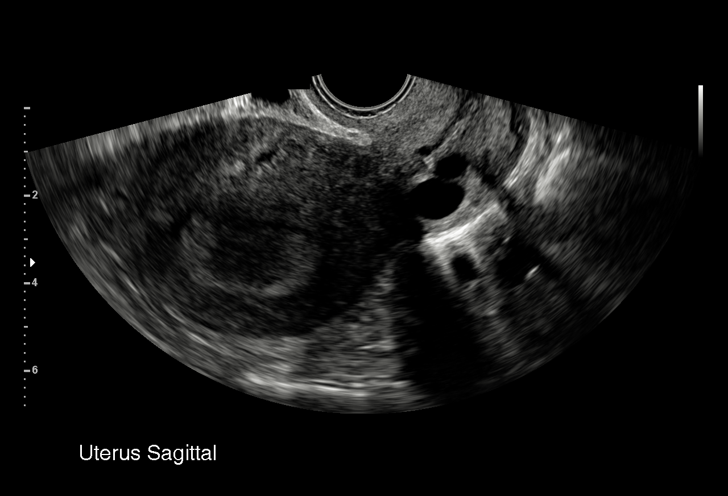
[im 11/48]
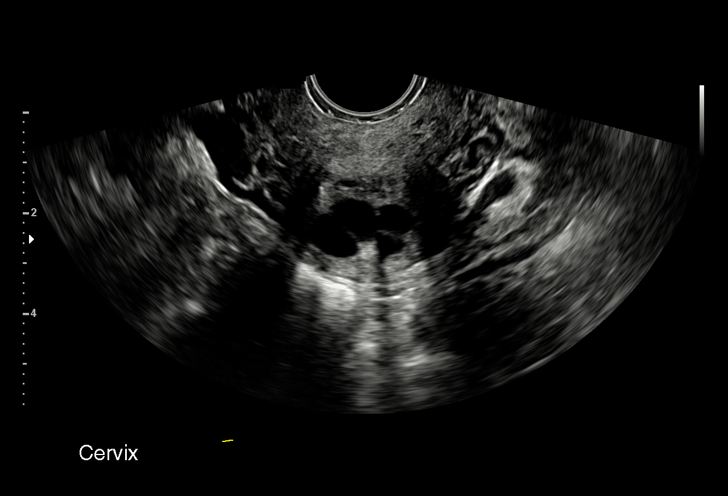
[im 14/48]
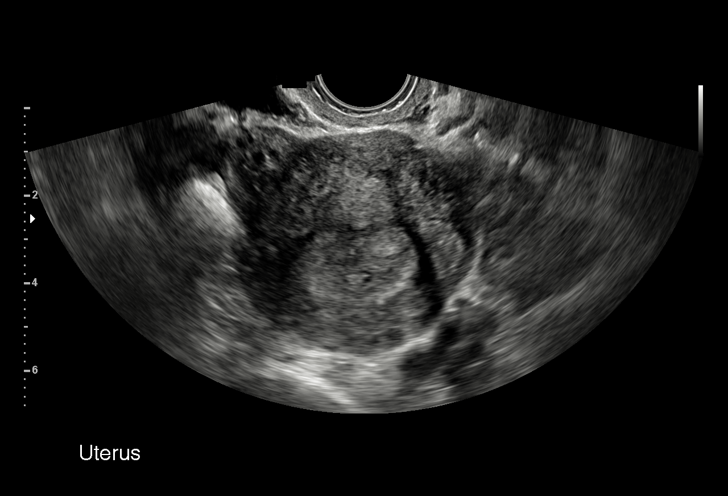
[im 18/48]
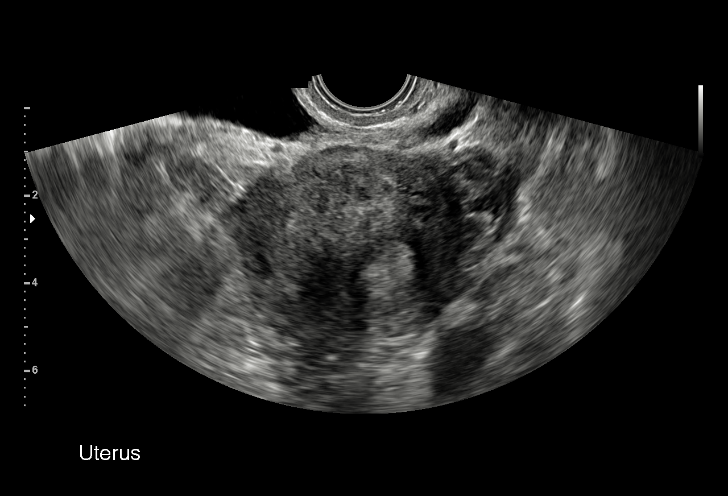
[im 21/48]
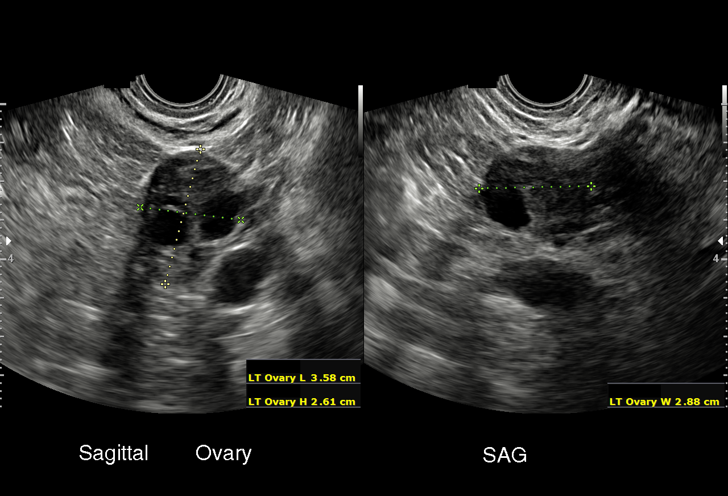
[im 25/48]
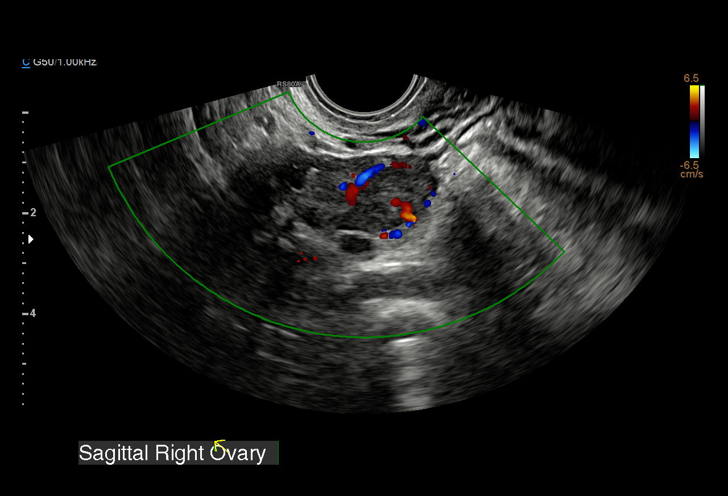
[im 27/48]
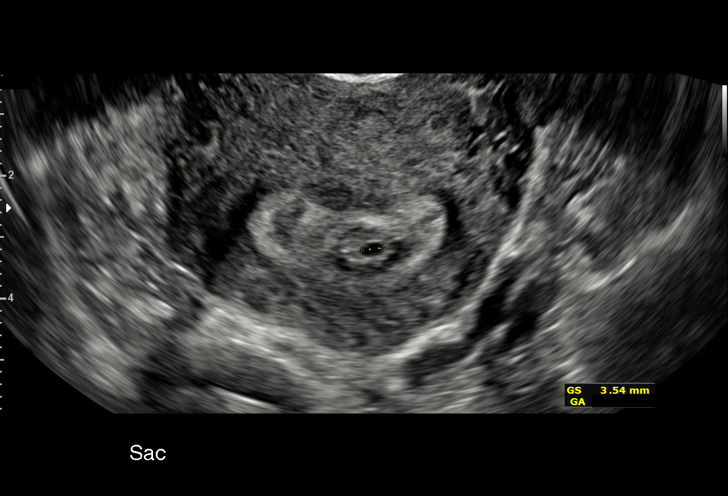
[im 30/48]
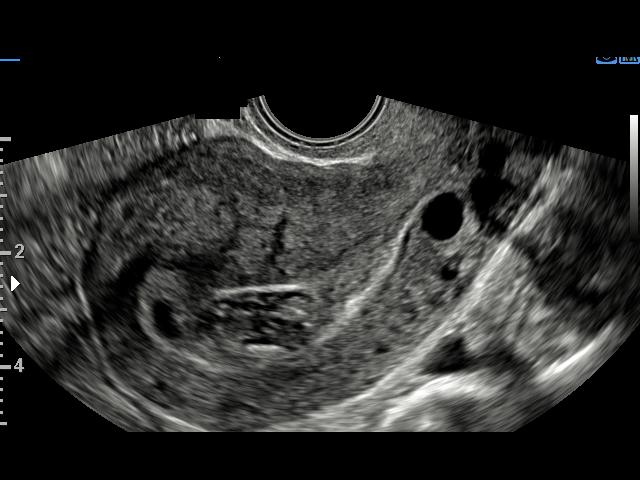
[im 34/48]
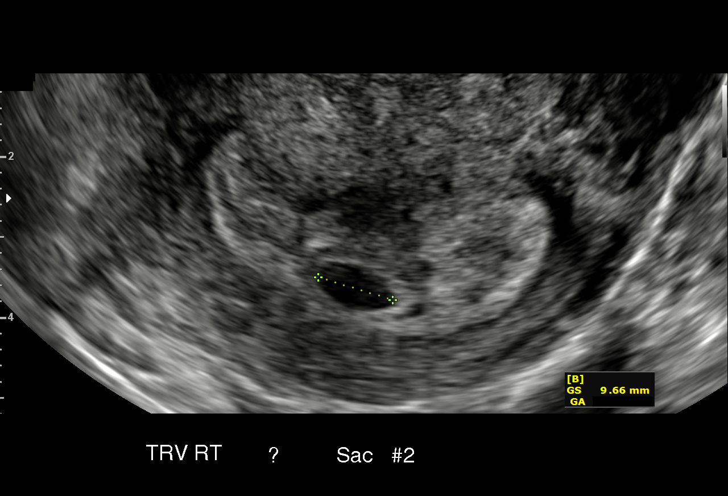
[im 37/48]
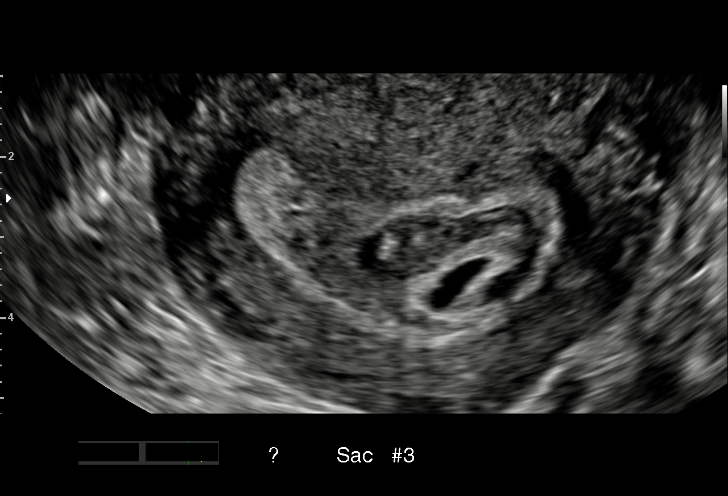
[im 41/48]
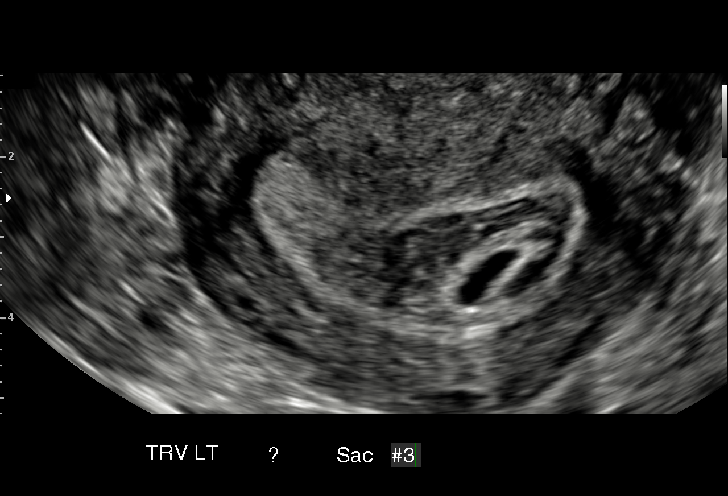
[im 44/48]
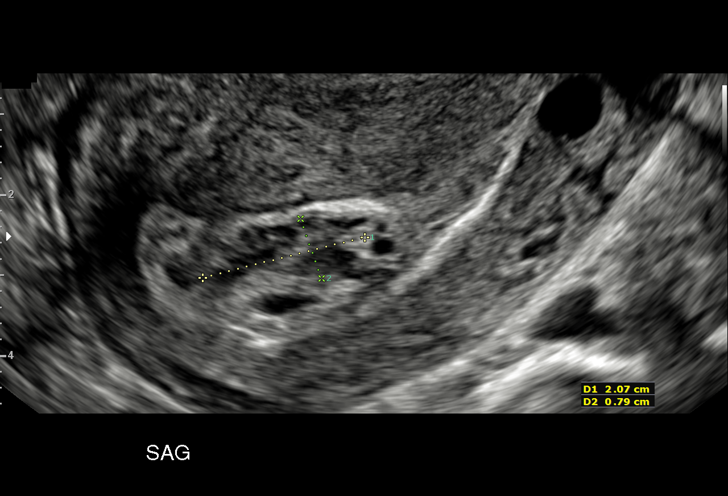
[im 48/48]
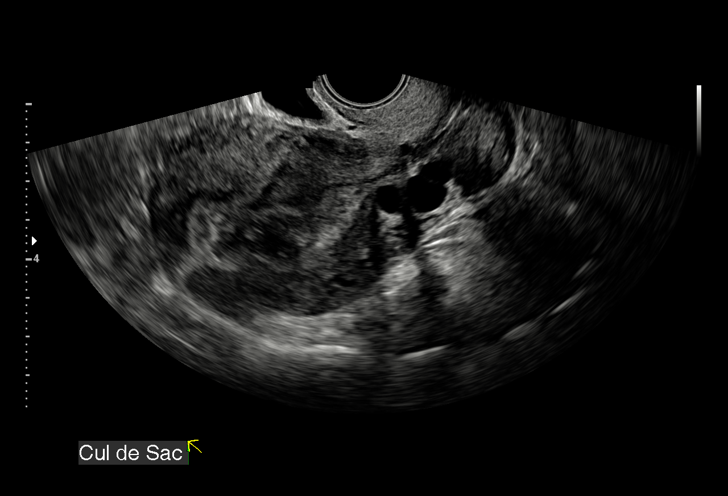

[15 of 28 positions shown; findings below may reference images not displayed]

FINDINGS: Number of IUPs: There are 3 intrauterine cystic structures none. No
fetal pole or yolk sac identified. These cystic structures may
represent early gestational sacs, endometrial cysts, or blighted
ovum. Clinical correlation and follow-up with ultrasound in 7-10
days, or earlier if clinically indicated, recommended.

TRIPLET 1

Yolk sac:  Not Visualized.

Embryo:  Not Visualized.

MSD:  3.1 mm corresponding to 5 w 0d

TRIPLET 2

Yolk sac:  Not Visualized.

Embryo:  Not Visualized.

MSD: 6.6 mm corresponding to 5w 2d

TRIPLET 3

Yolk sac:  Not Visualized.

Embryo:  Not Visualized.

MSD: 5 mm corresponding to 5w 1d

Subchorionic hemorrhage: Moderate subchorionic hemorrhage measuring
2.1 x 0.8 x 2.6 cm

Maternal uterus/adnexae: The maternal ovaries appear unremarkable.
Multiple nabothian cysts noted in the lower uterus.
IMPRESSION: Three cystic structures within the endometrium as described may
represent early gestational sacs. No fetal pole or yolk sac
identified at this time. Clinical correlation and follow-up with HCG
levels and ultrasound in 7-10 days, or earlier if clinically
indicated, recommended.

## 2020-02-22 IMAGING — US TRANSVAGINAL OB ULTRASOUND
1 series · 15 of 28 positions shown · non-contrast
Comparison: 10/12/2018

CLINICAL DATA: Vaginal bleeding

EXAM:
OBSTETRIC <14 WK ULTRASOUND
TECHNIQUE: Transabdominal ultrasound was performed for evaluation of the
gestation as well as the maternal uterus and adnexal regions.

[Series 1: transvaginal ob ultrasound · 15 of 41 slices shown]
[im 1/41]
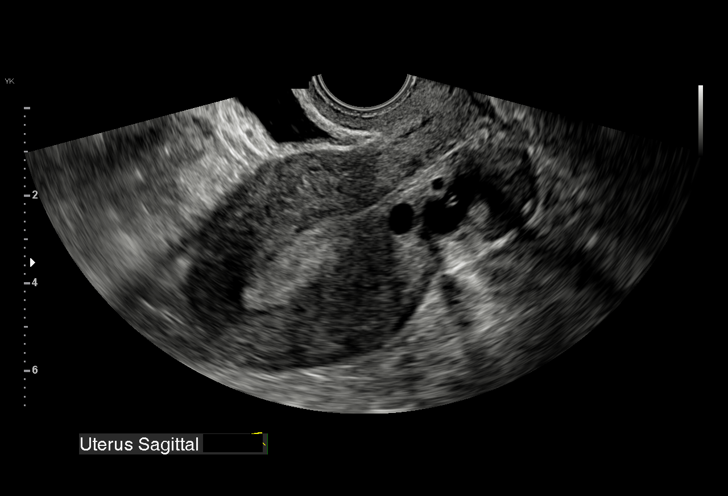
[im 3/41]
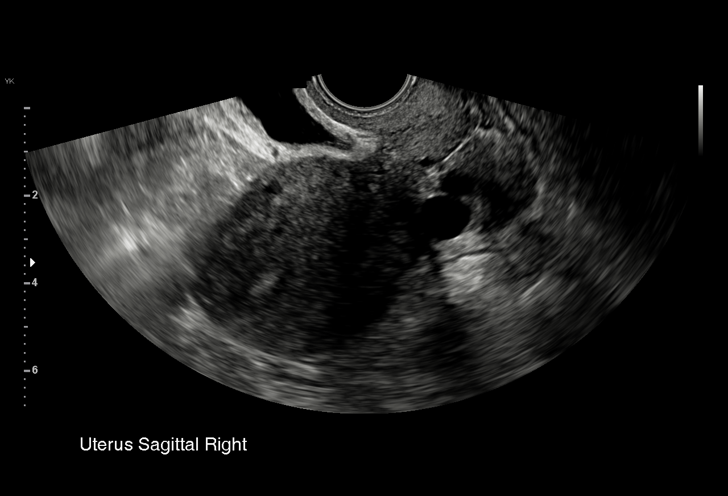
[im 6/41]
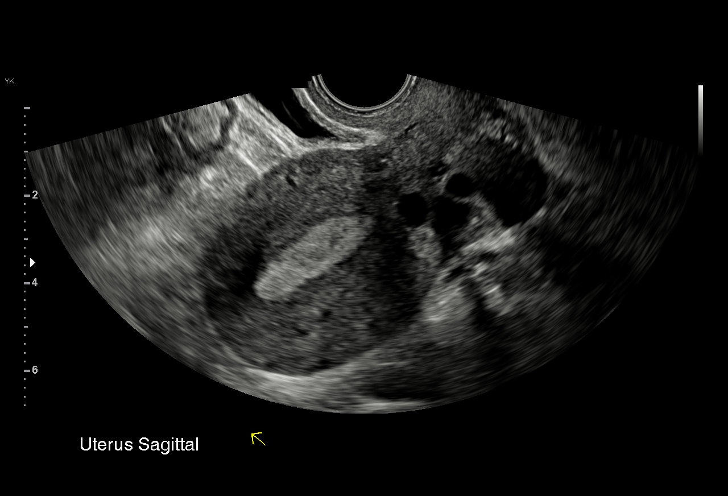
[im 9/41]
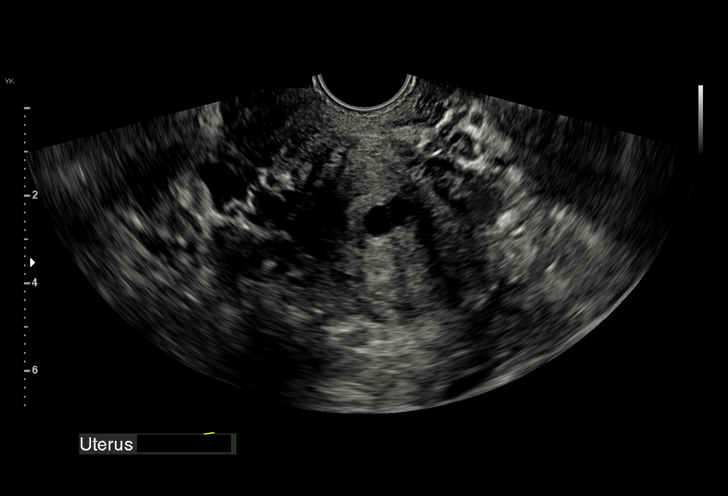
[im 12/41]
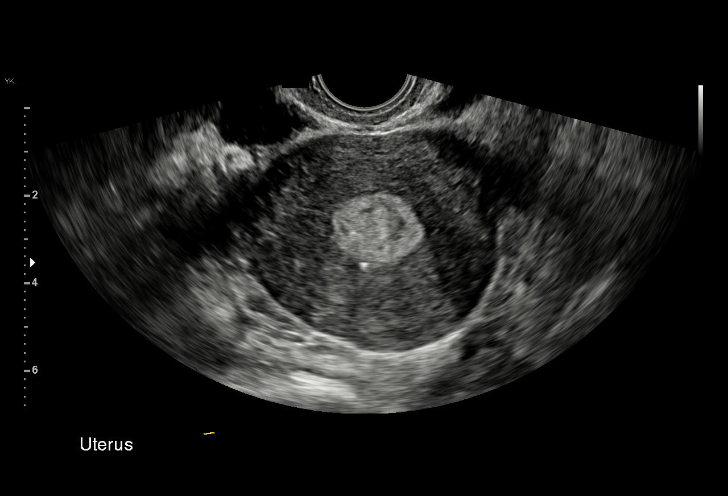
[im 15/41]
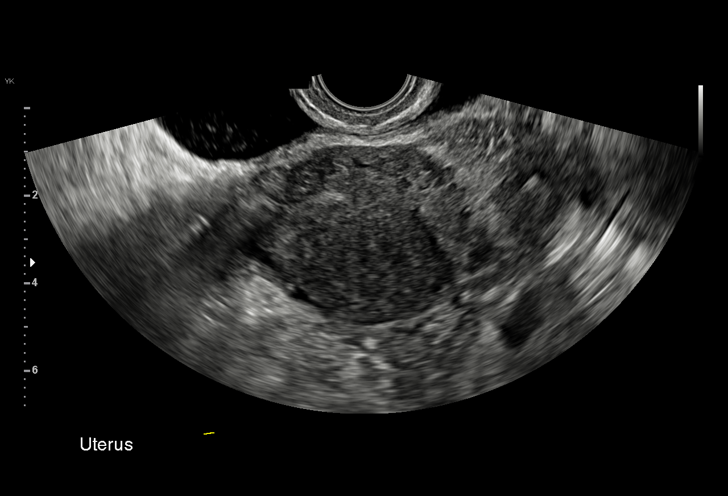
[im 18/41]
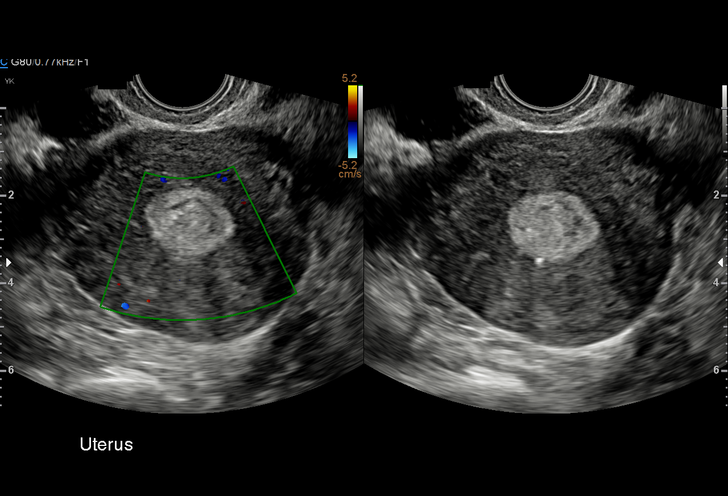
[im 21/41]
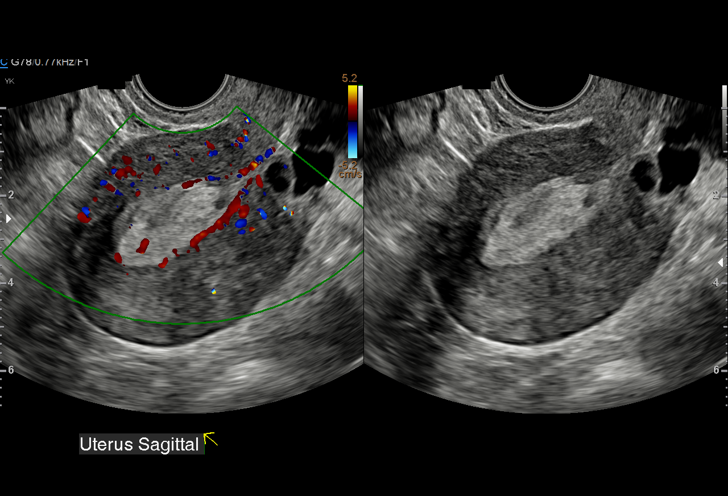
[im 23/41]
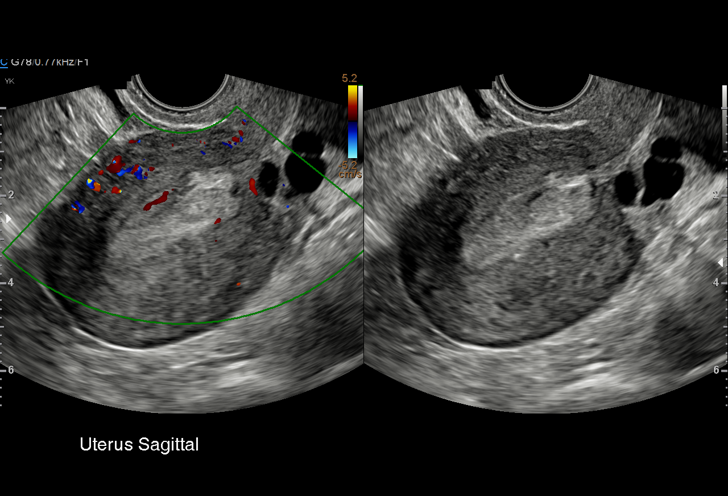
[im 26/41]
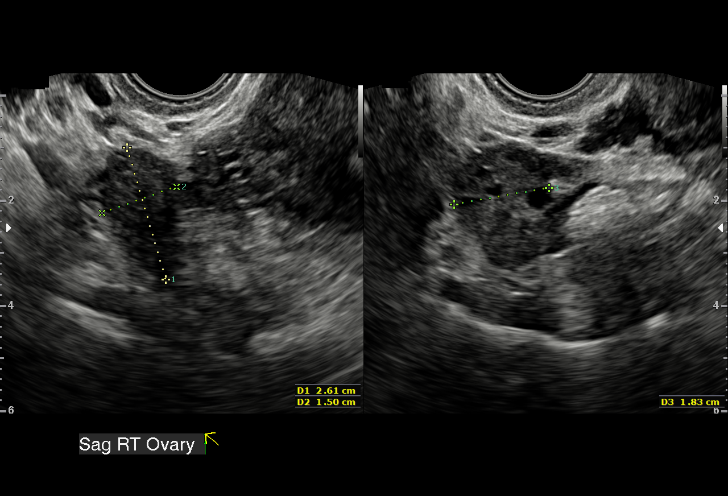
[im 29/41]
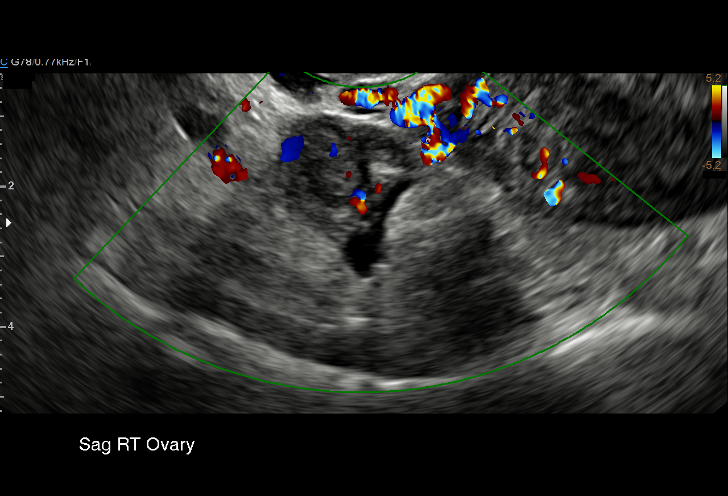
[im 32/41]
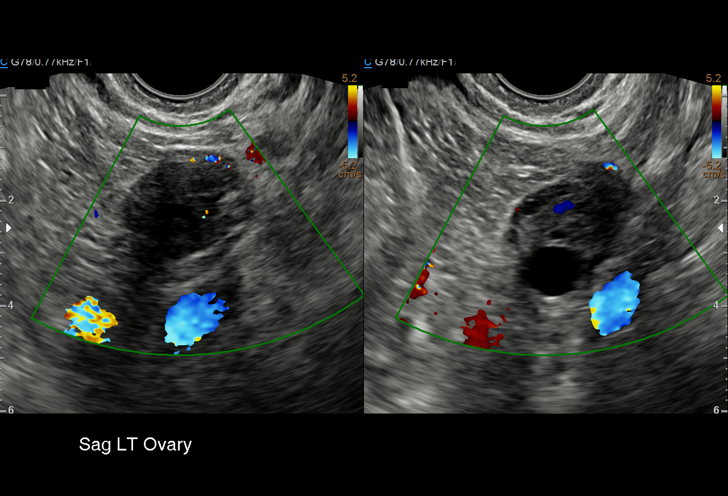
[im 35/41]
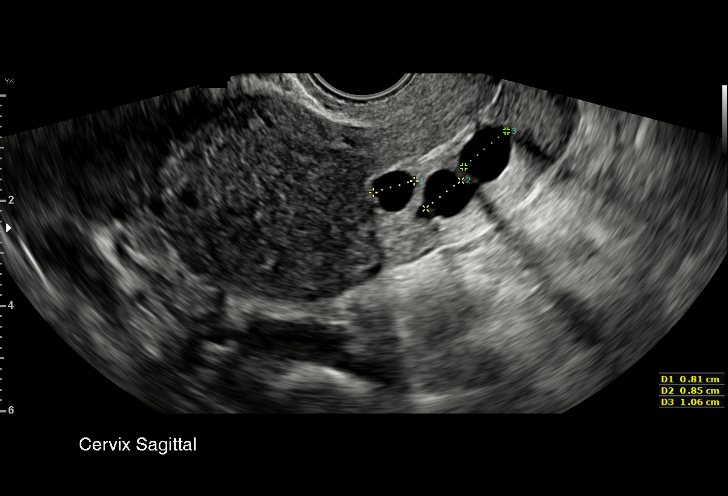
[im 38/41]
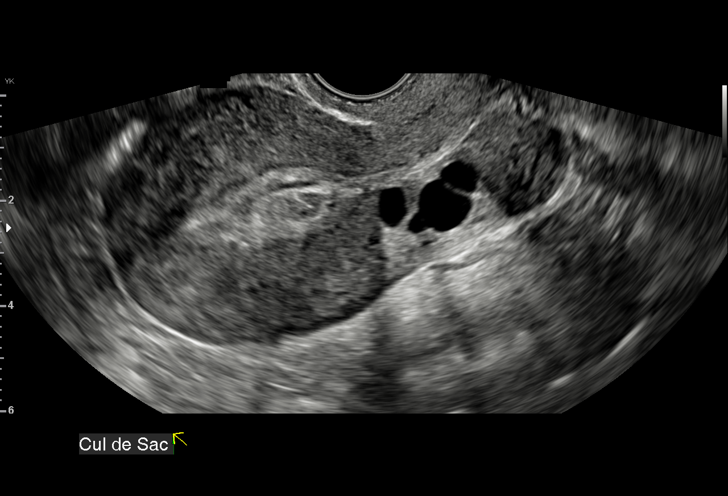
[im 41/41]
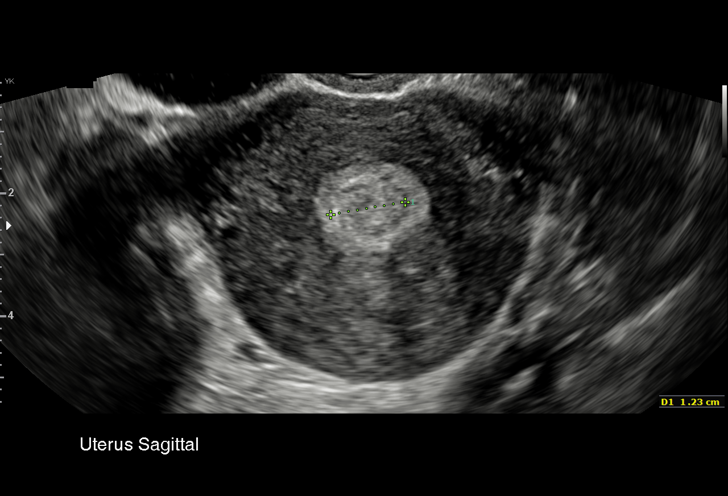

[15 of 28 positions shown; findings below may reference images not displayed]

FINDINGS: Intrauterine gestational sac: None

Yolk sac:  Not visualized

Embryo:  Not visualized

Cardiac Activity: Not visualized

Heart Rate:  bpm

MSD:    mm    w     d

CRL:     mm    w  d                  US EDC:

Subchorionic hemorrhage:  None visualized.

Maternal uterus/adnexae: Endometrium is thickened and heterogeneous,
measuring up to 14 mm in thickness. Previously seen cystic areas
within the endometrium no longer visualized. Solid-appearing 1.7 x
1.2 x 0.8 cm area centrally within the endometrium. Nabothian cysts
noted within the cervix. No adnexal mass or free fluid.
IMPRESSION: Heterogeneous, heterogeneous endometrium measuring 14 mm in
thickness. Previously seen cystic areas within the endometrium no
longer visualized. 1.7 cm solid-appearing area now seen within the
endometrium. Recommend continued follow-up with serial beta HCGs and
ultrasounds.
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
## 2020-04-03 NOTE — Progress Notes (Signed)
 I saw and evaluated the patient and reviewed the resident's note.  I agree with the resident's findings and plan.     Electronically signed by: Powell Almarie Berg, MD 04/03/2020 12:05 PM    Electronically signed by: Powell Almarie Berg, MD 04/03/20 [phone]

## 2020-08-16 NOTE — Discharge Summary (Signed)
 ------------------------------------------------------------------------------- Attestation signed by Doreene Bame, MD at 08/18/2020 11:45 AM I saw and evaluated the patient and reviewed the resident's note. I agree with the resident's findings and plan. Total time spent with patient was >30 minutes.   Electronically Signed by: Bame Doreene, MD, Attending Physician 08/18/2020 11:45 AM  -------------------------------------------------------------------------------  Gen Med HP I Discharge Summary  Name: Crystal Ayala MRN: [id number] Age: 35 yrs DOB: [birth year]  Admit date: 08/14/2020 Discharge date and time: 08/16/2020 1:15 PM  Admitting Physician: Lauraine Almarie Shadow, MD Discharge Physician: Bame Doreene, MD  Admission Diagnoses:  Right flank pain [R10.9] Pyelonephritis of right kidney [N12] Sepsis, due to unspecified organism, unspecified whether acute organ dysfunction present [facility]) [A41.9]   Discharge Diagnoses:  Pyelonephritis of right kidney Alcohol withdrawal Opioid dependence  Admission Condition: poor Discharged Condition: fair  Hospital Course:  For full details, please see H&P, progress notes, consult notes and ancillary notes. Briefly, patient is a 35 year old female with past medical history of obesity, alcoholism, hepatic steatosis, prior IVDU, COPD, and untreated hepatitis C who presented to the ED with nausea and dysuria found to have right sided pyelonephritis.  Patient was admitted to [facility] GEN med 1 for further management.  The ED had initiated the patient on IV meropenem given her history of anaphylaxis with penicillins.  She was initially febrile and tachycardic which resolved with antimicrobial therapy.  Her urine culture grew pan susceptible E. coli, so she was deescalated to ciprofloxacin .  Her course was complicated by severe alcohol withdrawal symptoms requiring a single dose of Haldol  and frequent doses of benzodiazepine. Given her  history of IVDU, psychiatry was consulted and started Suboxone. They also initiated her on a Librium taper. The remainder of her course was otherwise uncomplicated.  The patient was seen by the attending physician on the date of discharge and deemed stable and acceptable for discharge.  The patient's chronic medical conditions were treated accordingly per the patient's home medication regimen.  The patient's medication reconciliation (with changes made to chronic medications), follow-up appointments, discharge orders, instructions and significant lab and diagnostic studies are noted below.  Discharge Follow-up Action Items: Follow-up with PCP in 1 to 2 weeks   Patient's Ordered Code Status: Full Code  Consults: IP CONSULT TO PSYCHIATRY  Significant Diagnostic Studies:  CBC: Recent Labs  Lab 08/14/20 1208 08/15/20 0142 08/16/20 0115  WBC 10.8 7.1 5.2  HGB 12.3 11.1* 10.5*  HCT 35.4* 32.3* 30.4*  PLT 133* 116* 104*  , DIFF:  Recent Labs  Lab 08/14/20 1208  NEUTOPHILPCT 81  LYMPHOPCT 10  MONOPCT 9  BASOPCT 0  EOSPCT 0  NEUTROABS 8.8*  LYMPHSABS 1.1  MONOSABS 0.9*  BASOSABS 0.0  EOSABS 0.0  , CMP:   Recent Labs  Lab 08/14/20 1208 08/15/20 0142 08/16/20 0115  NA 137 136 132*  K 3.4* 3.8 3.8  CL 107 108 104  CO2 22* 24 24  BUN 5* 5* 7*  CREATININE 0.71 0.60 0.57  GLU 118* 93 108*  CALCIUM 8.0* 7.8* 7.6*  MG 1.2* 1.4* 1.8  BILITOT 1.5* 1.0  --   AST 45* 39  --   ALT 42 31  --   ALKPHOS 76 65  --   PROT 7.2 6.6  --   ALBUMIN 3.6 3.2*  --   , Coags:  No results for input(s): LABPROT, INR, PTT, DFIB in the last 168 hours., Cardiac:  No results for input(s): TROPONINI, CK, CKMB, CKMBP, BNP in the last 72  hours., Glycemic control:    and Blood Gas: No results for input(s): PH, PCO2, PO2, HCO3, BDF, O2S in the last 168 hours.  Results for orders placed or performed during the hospital encounter of 08/14/20  CT ABDOMEN PELVIS W CONTRAST (ROUTINE)   Narrative    CLINICAL DATA:  Right flank pain, fever, dysuria  EXAM: CT ABDOMEN AND PELVIS WITH CONTRAST  TECHNIQUE: Multidetector CT imaging of the abdomen and pelvis was performed using the standard protocol following bolus administration of intravenous contrast.  CONTRAST:  100 mL Omnipaque 350 iodinated contrast IV  COMPARISON:  10/30/2014  FINDINGS: Lower chest: No acute abnormality.  Hepatobiliary: Hepatomegaly, maximum coronal span 25.0 cm, and hepatic steatosis. No solid liver abnormality is seen. No gallstones, gallbladder wall thickening, or biliary dilatation.  Pancreas: Unremarkable. No pancreatic ductal dilatation or surrounding inflammatory changes.  Spleen: Splenomegaly, maximum coronal span 15.5 cm.  Adrenals/Urinary Tract: Adrenal glands are unremarkable. There is unilateral right-sided perinephric fat stranding. The left kidney is normal, without renal calculi, solid lesion, or hydronephrosis. Bladder is unremarkable.  Stomach/Bowel: Stomach is within normal limits. Appendix appears normal. No evidence of bowel wall thickening, distention, or inflammatory changes.  Vascular/Lymphatic: No significant vascular findings are present. No enlarged abdominal or pelvic lymph nodes.  Reproductive: No mass or other significant abnormality. Bilateral ovarian cysts and follicles.  Other: No abdominal wall hernia or abnormality. No abdominopelvic ascites.  Musculoskeletal: No acute or significant osseous findings.  IMPRESSION: 1. There is unilateral right-sided perinephric fat stranding, suggestive of pyelonephritis. No evidence of urinary tract calculus or hydronephrosis. 2. Hepatomegaly and hepatic steatosis. 3. Splenomegaly.   Electronically Signed   By: Marolyn Jaksch M.D.   On: 08/14/2020 14:51     Disposition:   Home  Patient Instructions:  Current Discharge Medication List    START taking these medications   Details  ciprofloxacin  HCl (CIPRO ) 500 MG  tablet Take 1 tablet (500 mg total) by mouth every 12 hours for 3 days. Qty: 6 tablet, Refills: 0    clotrimazole-betamethasone (LOTRISONE) 1-0.05 % cream Apply topically 2 times daily. Qty: 15 g, Refills: 0    diphenhydrAMINE  (BENADRYL ) 25 mg capsule Take 1 capsule (25 mg total) by mouth every 6 (six) hours as needed for up to 10 days for Itching. Qty: 30 capsule, Refills: 0    folic acid (FOLVITE) 1 MG tablet Take 1 tablet (1 mg total) by mouth daily. Qty: 30 tablet, Refills: 2    thiamine (VITAMIN B-1) 100 MG tablet Take 1 tablet (100 mg total) by mouth daily. Qty: 30 tablet, Refills: 2      CONTINUE these medications which have CHANGED   Details  escitalopram oxalate (LEXAPRO) 10 MG tablet Take 2 tablets (20 mg total) by mouth daily. Qty: 135 tablet, Refills: 0   Associated Diagnoses: Post partum depression      CONTINUE these medications which have NOT CHANGED   Details  acetaminophen  (TYLENOL ) 325 MG tablet Take 2 tablets (650 mg total) by mouth every 6 (six) hours. Qty: 30 tablet, Refills: 0    phenazopyridine (AZO) 95 MG tablet Take 95 mg by mouth 3 (three) times daily as needed for Pain.    docusate sodium (COLACE) 100 MG capsule Take 1 capsule (100 mg total) by mouth 2 (two) times daily as needed for Constipation. Qty: 30 capsule, Refills: 0      STOP taking these medications     buprenorphine -naloxone (SUBOXONE) 8-2 mg SL film      clindamycin (  CLEOCIN) 300 MG capsule      cloNIDine (CATAPRES) 0.1 MG tablet      hydrOXYzine HCL (ATARAX) 10 MG tablet      QUEtiapine  (SEROQUEL ) 50 MG tablet             Follow-up  [facility] APPOINTMENTS    Appointment Date and Time Provider Department Dept Phone Address   08/22/2020 11:15 AM Debra Donzell [place], ANP [facility] [place] [phone] [address] [place] [address]        Future Appointments  Date Time Provider Department  Center  08/22/2020 11:15 AM Adrien Donzell [place], ANP [id number] [facility]    Electronically signed by: Caron Jacob Poser, MD 08/16/2020   Caron Poser, MD  PGY-3, Dept of Internal Medicine   Electronically signed by:  Caron Jacob Poser, MD 08/16/2020 1:15 PM    Electronically signed by: Caron Jacob Poser, MD Resident 08/18/20 1012    Electronically signed by: Scarlett Sax, MD 08/18/20 1145

## 2022-04-08 NOTE — Progress Notes (Signed)
 .  Chief Complaint: Chief Complaint  Patient presents with  . New Patient    Patient states, I walk on the sides of my feet and I have heel spurs. The other doctor said I needed surgery.      HPI  36 y/o patient presents today with their parent for evaluation and treatment of increased arch height and walking on the outside of her feet.   The patient parent reports that the pain has been present after prolonged standing and walking and has noticed that the feet turn inward. The patient denies any recent trauma to the feet. She states being told that surgery is an option for her feet.  ROS: ROS Constitutional: Negative for activity change, appetite change, chills, fever and unexpected weight change.  HENT: Negative for nosebleeds, trouble swallowing and voice change.   Respiratory: Negative for chest tightness, shortness of breath, wheezing and stridor.   Cardiovascular: Negative for chest pain, palpitations and leg swelling.  Gastrointestinal: Negative for abdominal pain, blood in stool, constipation and diarrhea.  Genitourinary: Negative for flank pain,or  hematuria  VITALS Vitals:   04/08/22 1322  BP: 130/80    Past Medical Hx: Past Medical History:  Diagnosis Date  . Alcohol abuse   . Anemia   . Anxiety   . Asthma   . Borderline personality disorder (HCC)   . Chest pain, pleuritic 10/19/2012   6-14.  From coughing.   SABRA COPD (chronic obstructive pulmonary disease) (HCC)   . Cough 10/10/2012   10-10-12   . Depression   . Generalized anxiety disorder 10/10/2012   [facility]   . Hepatitis C   . History of eclampsia 10/10/2012   With one pregnancy   . Hypertension   . Kidney stone 11/18/2019  . Lumbar strain 10/27/2012  . Lump of left breast 10/06/2013   10/06/13, 8 o'clock position, 1.5 x 1 cm  and 0.5 cm. Painful   . Neuropathy   . Opioid use disorder 08/18/2020  . Preeclampsia, H/O 10/10/2012  . PTSD (post-traumatic stress disorder)   . Restless leg  syndrome   . Seizures (HCC)    with pregnancy  . Tobacco use disorder 10/10/2012    Current Meds:  Current Outpatient Medications:  .  acetaminophen  (TYLENOL ) 325 MG tablet, Take 2 tablets (650 mg total) by mouth every 6 (six) hours., Disp: 30 tablet, Rfl: 0 .  buprenorphine -naloxone 2-0.5 mg, PLACE 6 TABLETS UNDER THE TONGUE ONCE DAILY, Disp: , Rfl:  .  QUEtiapine  (SEROQUEL ) 100 MG tablet, TAKE 1 TABLET BY MOUTH DAILY ABOUT 1-2 HOURS AFTER DINNER., Disp: , Rfl:  .  sofosbuvir-velpatasvir (EPCLUSA) 400-100 mg per tablet, , Disp: , Rfl:   Current Facility-Administered Medications:  .  medroxyPROGESTERone (DEPO-PROVERA) injection 150 mg, 150 mg, Intramuscular, Once, Vandermeersch, Charmaine Jansky, MD  Allergies: Allergies  Allergen Reactions  . Penicillins Anaphylaxis (ALLERGY)  . Prozac [Fluoxetine] Other (See Comments)    Mean  . Artificial Tears Enhanced Swelling (ALLERGY/intolerance)  . Buspar [Buspirone] Other (See Comments)    Made her feel crazy...hearing voices... dreams of killing people  . Clear Eyes [Naphazoline] Swelling (ALLERGY/intolerance)    CAUSES RED EYE  . Nickel Itching / Pruritis (ALLERGY/intolerance)    discharge  . Zyrtec [Cetirizine] Vomiting (intolerance)  . Latex Rash (ALLERGY/intolerance)    Burns and Eats skin   . Tomato Rash (ALLERGY/intolerance)    Acid breaks out mouth    Exam: NV status is intact Zero tenderness is noted along the inside  of the medial foot and ankle There is no medial or lateral foot and ankle swelling +5/5 DF/PF muscle strength is noted 0 degrees of passive right ankle joint dorsiflexion There is an increase in the medial and transverse arch height No associated redness or swelling or bruising Normal and nonantalgic gait during barefoot walking and standing in the office  Dx: 1. Metatarsalgia of both feet      2. Pes cavus of both feet         Plan: The patients clinical and radiographic findings today are  consistent with having bilateral flexible and mild cavus feet. She also has a bilateral contacted heel cord. Today, I carefully explained my findings to the patient's parent. I have recommended custom orthotics to allow functional and supportive painfree walking and standing. See orders. She doe snot need to have any surgery.  All questions were answered today.  Orders: Refer patient for one pair of custom orthotics to provide support  Future Appt.:      Dial, Dekarlos Megail, DPM   04/08/2022    Electronically signed by: Dial, Dekarlos Megail, DPM 04/08/22 1349

## 2024-01-27 ENCOUNTER — Inpatient Hospital Stay (HOSPITAL_COMMUNITY)
Admission: AD | Admit: 2024-01-27 | Discharge: 2024-02-02 | DRG: 897 | Disposition: A | Discharge status: Home / Self Care | Source: Intra-hospital | Attending: Student in an Organized Health Care Education/Training Program | Admitting: Student in an Organized Health Care Education/Training Program

## 2024-01-27 ENCOUNTER — Other Ambulatory Visit: Payer: Self-pay

## 2024-01-27 ENCOUNTER — Encounter (HOSPITAL_COMMUNITY): Payer: Self-pay | Admitting: Psychiatry

## 2024-01-27 DIAGNOSIS — F603 Borderline personality disorder: Secondary | ICD-10-CM | POA: Insufficient documentation

## 2024-01-27 DIAGNOSIS — F419 Anxiety disorder, unspecified: Secondary | ICD-10-CM | POA: Insufficient documentation

## 2024-01-27 DIAGNOSIS — F1590 Other stimulant use, unspecified, uncomplicated: Secondary | ICD-10-CM | POA: Insufficient documentation

## 2024-01-27 DIAGNOSIS — F1994 Other psychoactive substance use, unspecified with psychoactive substance-induced mood disorder: Principal | ICD-10-CM | POA: Diagnosis present

## 2024-01-27 DIAGNOSIS — Z880 Allergy status to penicillin: Secondary | ICD-10-CM

## 2024-01-27 DIAGNOSIS — J449 Chronic obstructive pulmonary disease, unspecified: Secondary | ICD-10-CM | POA: Diagnosis present

## 2024-01-27 DIAGNOSIS — F4310 Post-traumatic stress disorder, unspecified: Secondary | ICD-10-CM | POA: Diagnosis present

## 2024-01-27 DIAGNOSIS — Z555 Less than a high school diploma: Secondary | ICD-10-CM

## 2024-01-27 DIAGNOSIS — Z9151 Personal history of suicidal behavior: Secondary | ICD-10-CM

## 2024-01-27 DIAGNOSIS — Z888 Allergy status to other drugs, medicaments and biological substances status: Secondary | ICD-10-CM | POA: Diagnosis not present

## 2024-01-27 DIAGNOSIS — Z79899 Other long term (current) drug therapy: Secondary | ICD-10-CM | POA: Diagnosis not present

## 2024-01-27 DIAGNOSIS — F17210 Nicotine dependence, cigarettes, uncomplicated: Secondary | ICD-10-CM | POA: Diagnosis present

## 2024-01-27 DIAGNOSIS — F1011 Alcohol abuse, in remission: Secondary | ICD-10-CM | POA: Diagnosis present

## 2024-01-27 DIAGNOSIS — N390 Urinary tract infection, site not specified: Secondary | ICD-10-CM | POA: Diagnosis present

## 2024-01-27 DIAGNOSIS — Z91040 Latex allergy status: Secondary | ICD-10-CM

## 2024-01-27 DIAGNOSIS — Z6841 Body Mass Index (BMI) 40.0 and over, adult: Secondary | ICD-10-CM | POA: Diagnosis not present

## 2024-01-27 DIAGNOSIS — M797 Fibromyalgia: Secondary | ICD-10-CM | POA: Diagnosis present

## 2024-01-27 DIAGNOSIS — R45851 Suicidal ideations: Secondary | ICD-10-CM | POA: Diagnosis present

## 2024-01-27 MED ORDER — BUPRENORPHINE HCL 8 MG SL SUBL
8.0000 mg | SUBLINGUAL_TABLET | Freq: Two times a day (BID) | SUBLINGUAL | Status: DC
  Administered 2024-01-27 – 2024-02-02 (×12): 8 mg via SUBLINGUAL
  Filled 2024-01-27 (×12): qty 1

## 2024-01-27 MED ORDER — ALUM & MAG HYDROXIDE-SIMETH 200-200-20 MG/5ML PO SUSP: 30.0000 mL | ORAL | Status: DC | PRN

## 2024-01-27 MED ORDER — SERTRALINE HCL 100 MG PO TABS
100.0000 mg | ORAL_TABLET | Freq: Every day | ORAL | Status: DC
Start: 2024-01-28 — End: 2024-02-02
  Administered 2024-01-28 – 2024-02-02 (×6): 100 mg via ORAL
  Filled 2024-01-27 (×8): qty 1

## 2024-01-27 MED ORDER — HALOPERIDOL LACTATE 5 MG/ML IJ SOLN: 5.0000 mg | Freq: Three times a day (TID) | INTRAMUSCULAR | Status: DC | PRN

## 2024-01-27 MED ORDER — DIPHENHYDRAMINE HCL 25 MG PO CAPS: 50.0000 mg | ORAL_CAPSULE | Freq: Three times a day (TID) | ORAL | Status: DC | PRN

## 2024-01-27 MED ORDER — DIPHENHYDRAMINE HCL 50 MG/ML IJ SOLN: 50.0000 mg | Freq: Three times a day (TID) | INTRAMUSCULAR | Status: DC | PRN

## 2024-01-27 MED ORDER — NICOTINE 21 MG/24HR TD PT24
21.0000 mg | MEDICATED_PATCH | Freq: Every day | TRANSDERMAL | Status: DC
  Administered 2024-01-28 – 2024-02-01 (×5): 21 mg via TRANSDERMAL
  Filled 2024-01-27 (×5): qty 1

## 2024-01-27 MED ORDER — LORAZEPAM 2 MG/ML IJ SOLN: 2.0000 mg | Freq: Three times a day (TID) | INTRAMUSCULAR | Status: DC | PRN

## 2024-01-27 MED ORDER — HALOPERIDOL LACTATE 5 MG/ML IJ SOLN: 10.0000 mg | Freq: Three times a day (TID) | INTRAMUSCULAR | Status: DC | PRN

## 2024-01-27 MED ORDER — HALOPERIDOL 5 MG PO TABS: 5.0000 mg | ORAL_TABLET | Freq: Three times a day (TID) | ORAL | Status: DC | PRN

## 2024-01-27 MED ORDER — ACETAMINOPHEN 325 MG PO TABS
650.0000 mg | ORAL_TABLET | Freq: Four times a day (QID) | ORAL | Status: DC | PRN
  Administered 2024-01-28 – 2024-01-29 (×4): 650 mg via ORAL
  Filled 2024-01-27 (×4): qty 2

## 2024-01-27 MED ORDER — NITROFURANTOIN MONOHYD MACRO 100 MG PO CAPS
100.0000 mg | ORAL_CAPSULE | Freq: Two times a day (BID) | ORAL | Status: DC
  Administered 2024-01-27 – 2024-01-30 (×6): 100 mg via ORAL
  Filled 2024-01-27 (×6): qty 1

## 2024-01-27 MED ORDER — MAGNESIUM HYDROXIDE 400 MG/5ML PO SUSP: 30.0000 mL | Freq: Every day | ORAL | Status: DC | PRN

## 2024-01-27 NOTE — Group Note (Deleted)
 Date:  01/27/2024 Time:  8:30 PM  Group Topic/Focus:  Wrap-Up Group:   The focus of this group is to help patients review their daily goal of treatment and discuss progress on daily workbooks.     Participation Level:  {BHH PARTICIPATION OZCZO:77735}  Participation Quality:  {BHH PARTICIPATION QUALITY:22265}  Affect:  {BHH AFFECT:22266}  Cognitive:  {BHH COGNITIVE:22267}  Insight: {BHH Insight2:20797}  Engagement in Group:  {BHH ENGAGEMENT IN HMNLE:77731}  Modes of Intervention:  {BHH MODES OF INTERVENTION:22269}  Additional Comments:  ***  Crystal Ayala 01/27/2024, 8:30 PM

## 2024-01-27 NOTE — Group Note (Signed)
 Date:  01/27/2024 Time:  9:35 PM  Group Topic/Focus:  Wrap-Up Group:   The focus of this group is to help patients review their daily goal of treatment and discuss progress on daily workbooks.    Participation Level:  Did Not Attend   Crystal Ayala 01/27/2024, 9:35 PM

## 2024-01-27 NOTE — Group Note (Signed)
 Date:  01/27/2024 Time:  8:41 PM  Group Topic/Focus:  Wrap-Up Group:   The focus of this group is to help patients review their daily goal of treatment and discuss progress on daily workbooks.    Participation Level:  Did Not Attend   Crystal Ayala 01/27/2024, 8:41 PM

## 2024-01-27 NOTE — Group Note (Deleted)
 Date:  01/27/2024 Time:  9:14 PM  Group Topic/Focus:  Wrap-Up Group:   The focus of this group is to help patients review their daily goal of treatment and discuss progress on daily workbooks.     Participation Level:  {BHH PARTICIPATION OZCZO:77735}  Participation Quality:  {BHH PARTICIPATION QUALITY:22265}  Affect:  {BHH AFFECT:22266}  Cognitive:  {BHH COGNITIVE:22267}  Insight: {BHH Insight2:20797}  Engagement in Group:  {BHH ENGAGEMENT IN HMNLE:77731}  Modes of Intervention:  {BHH MODES OF INTERVENTION:22269}  Additional Comments:  ***  [name] 01/27/2024, 9:14 PM

## 2024-01-27 NOTE — Group Note (Deleted)
 Date:  01/27/2024 Time:  8:27 PM  Group Topic/Focus:  Wrap-Up Group:   The focus of this group is to help patients review their daily goal of treatment and discuss progress on daily workbooks.     Participation Level:  {BHH PARTICIPATION OZCZO:77735}  Participation Quality:  {BHH PARTICIPATION QUALITY:22265}  Affect:  {BHH AFFECT:22266}  Cognitive:  {BHH COGNITIVE:22267}  Insight: {BHH Insight2:20797}  Engagement in Group:  {BHH ENGAGEMENT IN HMNLE:77731}  Modes of Intervention:  {BHH MODES OF INTERVENTION:22269}  Additional Comments:  ***  Kada Friesen Dacosta 01/27/2024, 8:27 PM

## 2024-01-27 NOTE — Group Note (Deleted)
 Date:  01/27/2024 Time:  8:55 PM  Group Topic/Focus:  Wrap-Up Group:   The focus of this group is to help patients review their daily goal of treatment and discuss progress on daily workbooks.     Participation Level:  {BHH PARTICIPATION OZCZO:77735}  Participation Quality:  {BHH PARTICIPATION QUALITY:22265}  Affect:  {BHH AFFECT:22266}  Cognitive:  {BHH COGNITIVE:22267}  Insight: {BHH Insight2:20797}  Engagement in Group:  {BHH ENGAGEMENT IN HMNLE:77731}  Modes of Intervention:  {BHH MODES OF INTERVENTION:22269}  Additional Comments:  ***  Sharyah Bostwick Dacosta 01/27/2024, 8:55 PM

## 2024-01-28 ENCOUNTER — Encounter (HOSPITAL_COMMUNITY): Payer: Self-pay | Admitting: Psychiatry

## 2024-01-28 ENCOUNTER — Encounter (HOSPITAL_COMMUNITY): Payer: Self-pay

## 2024-01-28 DIAGNOSIS — F603 Borderline personality disorder: Secondary | ICD-10-CM | POA: Insufficient documentation

## 2024-01-28 DIAGNOSIS — F1590 Other stimulant use, unspecified, uncomplicated: Secondary | ICD-10-CM | POA: Insufficient documentation

## 2024-01-28 DIAGNOSIS — F419 Anxiety disorder, unspecified: Secondary | ICD-10-CM | POA: Insufficient documentation

## 2024-01-28 MED ORDER — GABAPENTIN 300 MG PO CAPS
300.0000 mg | ORAL_CAPSULE | Freq: Two times a day (BID) | ORAL | Status: DC
Start: 2024-01-28 — End: 2024-01-28

## 2024-01-28 MED ORDER — GABAPENTIN 300 MG PO CAPS
300.0000 mg | ORAL_CAPSULE | Freq: Two times a day (BID) | ORAL | Status: DC
Start: 2024-01-28 — End: 2024-01-29
  Administered 2024-01-28 – 2024-01-29 (×2): 300 mg via ORAL
  Filled 2024-01-28 (×2): qty 1

## 2024-01-28 MED ORDER — DIAZEPAM 2 MG PO TABS
1.0000 mg | ORAL_TABLET | Freq: Three times a day (TID) | ORAL | Status: DC | PRN
  Administered 2024-01-28 – 2024-02-01 (×8): 1 mg via ORAL
  Filled 2024-01-28 (×10): qty 1

## 2024-01-28 MED ORDER — ALBUTEROL SULFATE HFA 108 (90 BASE) MCG/ACT IN AERS: 2.0000 | INHALATION_SPRAY | RESPIRATORY_TRACT | Status: DC | PRN

## 2024-01-28 MED ORDER — CIPROFLOXACIN-DEXAMETHASONE 0.3-0.1 % OT SUSP
4.0000 [drp] | Freq: Two times a day (BID) | OTIC | Status: DC
  Administered 2024-01-28 – 2024-01-30 (×4): 4 [drp] via OTIC
  Filled 2024-01-28 (×2): qty 7.5
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
## 2024-01-28 NOTE — BHH Suicide Risk Assessment (Signed)
 [facility] Admission Suicide Risk Assessment   Nursing information obtained from:  Patient Demographic factors:  NA Current Mental Status:  NA Loss Factors:  NA Historical Factors:  NA Risk Reduction Factors:  NA  Total Time spent with patient: 1 hour Principal Problem: Substance induced mood disorder (HCC) Diagnosis:  Principal Problem:   Substance induced mood disorder (HCC)   Subjective Data:   Crystal Ayala is a 38 y.o. female  with a past psychiatric history of Borderline PD, MDD, GAD, PTSD, ETOH, and recent relapse on methamphetamine. Patient initially arrived to [facility] on 9/17 for acute psychiatric decompensation and suicidal ideation, and admitted to [facility] under IVC on 9/19 for acute safety concerns, acute suicidal or self-harming behaviors, and substance related issues. PMHx is significant for fibromyalgia, Hepatitis C (cured), COPD among others.   Continued Clinical Symptoms:  Alcohol Use Disorder Identification Test Final Score (AUDIT): 0 The Alcohol Use Disorders Identification Test, Guidelines for Use in Primary Care, Second Edition.  World Science writer [facility]). Score between 0-7:  no or low risk or alcohol related problems. Score between 8-15:  moderate risk of alcohol related problems. Score between 16-19:  high risk of alcohol related problems. Score 20 or above:  warrants further diagnostic evaluation for alcohol dependence and treatment.   CLINICAL FACTORS:   Severe Anxiety and/or Agitation Depression:   Anhedonia Hopelessness Impulsivity Severe Alcohol/Substance Abuse/Dependencies Personality Disorders:   Cluster B Chronic Pain More than one psychiatric diagnosis Unstable or Poor Therapeutic Relationship Previous Psychiatric Diagnoses and Treatments Medical Diagnoses and Treatments/Surgeries   Psychiatric Specialty Exam:   Presentation    General Appearance:  Casual; Disheveled   Eye Contact:  Fair   Speech:  Clear and Coherent    Speech Volume:  Normal   Handedness: No data recorded   Mood and Affect    Mood:  Euthymic   Affect:  Flat     Thinking     Thought Processes:  Goal Directed; Linear   Descriptions of Associations:  Intact   Orientation:  Full (Time, Place and Person)   Thought Content:  Logical   History of Schizophrenia/Schizoaffective disorder: No data recorded   Duration of Psychotic Symptoms: N/A   Hallucinations:  None   Ideas of Reference:  None   Suicidal Thoughts:  No   Homicidal Thoughts:  No     Sensorium      Memory:  Immediate Fair   Judgment:  Poor   Insight:  Poor     Executive Functions      Concentration:  Fair   Attention Span:  Fair   Recall:  Fair   Fund of Knowledge:  Fair   Language:  Fair     Psychomotor Activity:  Normal       Assets: No data recorded     Sleep:  Good 9.5       Physical Exam Vitals reviewed.  Constitutional:      General: She is not in acute distress.    Appearance: She is obese.  Pulmonary:     Effort: Pulmonary effort is normal. No respiratory distress.  Abdominal:     Palpations: Abdomen is soft.  Neurological:     Mental Status: She is alert.     Review of Systems  Constitutional:  Negative for chills and fever.  HENT:  Positive for ear discharge.        R ear fullness, serous discharge, 2 days  Gastrointestinal:  Negative for nausea and vomiting.   Blood  pressure 134/87, pulse (!) 103, temperature 98.1 F (36.7 C), temperature source Oral, resp. rate 18, height 5' 2 (1.575 m), weight 133.2 kg, SpO2 96%, unknown if currently breastfeeding. Body mass index is 53.7 kg/m.   COGNITIVE FEATURES THAT CONTRIBUTE TO RISK:  Closed-mindedness and Loss of executive function    SUICIDE RISK:   Severe:  Patient denies suicidal ideation but petitioner and MIL endorse that patient said, smiling, that patient would kill herself and laugh while doing so. Patient is inconsistent with  history of suicidal ideation, denies at first and then later endorses suicide attempt by hanging. She is currently minimizing symptoms of substance use as well as depression and her impulsive behaviors make her a serious risk of threat to self at this time.   PLAN OF CARE: See H&P for assessment and plan.   I certify that inpatient services furnished can reasonably be expected to improve the patient's condition.   Doranne Schmutz, MD 01/28/2024, 3:06 PM

## 2024-01-28 NOTE — Progress Notes (Signed)
(  Sleep Hours) - 9.5 hours (Any PRNs that were needed, meds refused, or side effects to meds)- none (Any disturbances and when (visitation, over night) (Concerns raised by the patient)- none (SI/HI/AVH)- denies

## 2024-01-28 NOTE — Plan of Care (Signed)

## 2024-01-28 NOTE — Plan of Care (Signed)
 Nurse discussed anxiety, depression and coping skills with patient.

## 2024-01-28 NOTE — BHH Group Notes (Signed)
 Adult Psychoeducational Group Note  Date:  01/28/2024 Time:  10:46 AM  Group Topic/Focus:  Goals Group:   The focus of this group is to help patients establish daily goals to achieve during treatment and discuss how the patient can incorporate goal setting into their daily lives to aide in recovery.  Participation Level:  Active  Participation Quality:  Appropriate  Affect:  Appropriate  Cognitive:  Appropriate  Insight: Appropriate  Engagement in Group:  Engaged  Modes of Intervention:  Exploration  Additional Comments:  Pt participated in Goals group. Pt stated their goal is to be positive.    Crystal Ayala 01/28/2024, 10:46 AM

## 2024-01-28 NOTE — BH IP Treatment Plan (Signed)
 Interdisciplinary Treatment and Diagnostic Plan Update  01/28/2024 Time of Session: 10:10 AM Crystal Ayala MRN: [id number]  Principal Diagnosis: Substance induced mood disorder (HCC)  Secondary Diagnoses: Principal Problem:   Substance induced mood disorder (HCC) Active Problems:   Stimulant use disorder   Borderline personality disorder (HCC)   Anxiety disorder, unspecified   Current Medications:  Current Facility-Administered Medications  Medication Dose Route Frequency Provider Last Rate Last Admin   acetaminophen  (TYLENOL ) tablet 650 mg  650 mg Oral Q6H PRN Mannie Jerel PARAS, NP   650 mg at 01/28/24 1710   albuterol  (VENTOLIN  HFA) 108 (90 Base) MCG/ACT inhaler 2 puff  2 puff Inhalation Q4H PRN Rollene Katz, MD       alum & mag hydroxide-simeth (MAALOX/MYLANTA) 200-200-20 MG/5ML suspension 30 mL  30 mL Oral Q4H PRN Mannie Jerel PARAS, NP       buprenorphine  (SUBUTEX ) sublingual tablet 8 mg  8 mg Sublingual BID Mannie Jerel PARAS, NP   8 mg at 01/28/24 1713   ciprofloxacin -dexamethasone  (CIPRODEX ) 0.3-0.1 % OTIC (EAR) suspension 4 drop  4 drop Right EAR BID Rollene Katz, MD       diazepam  (VALIUM ) tablet 1 mg  1 mg Oral Q8H PRN Rollene Katz, MD       haloperidol  (HALDOL ) tablet 5 mg  5 mg Oral TID PRN Mannie Jerel PARAS, NP       And   diphenhydrAMINE  (BENADRYL ) capsule 50 mg  50 mg Oral TID PRN Mannie Jerel PARAS, NP       haloperidol  lactate (HALDOL ) injection 5 mg  5 mg Intramuscular TID PRN Mannie Jerel PARAS, NP       And   diphenhydrAMINE  (BENADRYL ) injection 50 mg  50 mg Intramuscular TID PRN Mannie Jerel PARAS, NP       And   LORazepam  (ATIVAN ) injection 2 mg  2 mg Intramuscular TID PRN Mannie Jerel PARAS, NP       haloperidol  lactate (HALDOL ) injection 10 mg  10 mg Intramuscular TID PRN Mannie Jerel PARAS, NP       And   diphenhydrAMINE  (BENADRYL ) injection 50 mg  50 mg Intramuscular TID PRN Mannie Jerel PARAS, NP       And   LORazepam  (ATIVAN ) injection 2 mg  2 mg  Intramuscular TID PRN Mannie Jerel PARAS, NP       gabapentin  (NEURONTIN ) capsule 300 mg  300 mg Oral BID Rollene Katz, MD   300 mg at 01/28/24 1159   magnesium  hydroxide (MILK OF MAGNESIA) suspension 30 mL  30 mL Oral Daily PRN Mannie Jerel PARAS, NP       nicotine  (NICODERM CQ  - dosed in mg/24 hours) patch 21 mg  21 mg Transdermal Q0600 Mannie Jerel PARAS, NP   21 mg at 01/28/24 9375   nitrofurantoin  (macrocrystal-monohydrate) (MACROBID ) capsule 100 mg  100 mg Oral BID Mannie Jerel PARAS, NP   100 mg at 01/28/24 1709   sertraline  (ZOLOFT ) tablet 100 mg  100 mg Oral Daily Mannie Jerel PARAS, NP   100 mg at 01/28/24 9187   PTA Medications: Medications Prior to Admission  Medication Sig Dispense Refill Last Dose/Taking   Buprenorphine  HCl-Naloxone HCl 8-2 MG FILM Place 1 Film under the tongue 2 (two) times daily.   Taking   diazepam  (VALIUM ) 5 MG tablet Take 5 mg by mouth 2 (two) times daily as needed.   01/27/2024   gabapentin  (NEURONTIN ) 300 MG capsule Take 300 mg by mouth 2 (two) times daily.   Taking  hydrOXYzine (ATARAX) 25 MG tablet Take 25 mg by mouth 3 (three) times daily as needed.   01/27/2024   sertraline  (ZOLOFT ) 100 MG tablet Take 100 mg by mouth daily.   Taking   VENTOLIN  HFA 108 (90 Base) MCG/ACT inhaler Inhale 2 puffs into the lungs every 4 (four) hours as needed for wheezing or shortness of breath.   01/27/2024   Melatonin 10 MG TABS Take 20 mg by mouth at bedtime.       Patient Stressors:    Patient Strengths:    Treatment Modalities: Medication Management, Group therapy, Case management,  1 to 1 session with clinician, Psychoeducation, Recreational therapy.   Physician Treatment Plan for Primary Diagnosis: Substance induced mood disorder (HCC) Long Term Goal(s):     Short Term Goals: Ability to identify changes in lifestyle to reduce recurrence of condition will improve Ability to verbalize feelings will improve Ability to disclose and discuss suicidal ideas  Medication  Management: Evaluate patient's response, side effects, and tolerance of medication regimen.  Therapeutic Interventions: 1 to 1 sessions, Unit Group sessions and Medication administration.  Evaluation of Outcomes: Progressing  Physician Treatment Plan for Secondary Diagnosis: Principal Problem:   Substance induced mood disorder (HCC) Active Problems:   Stimulant use disorder   Borderline personality disorder (HCC)   Anxiety disorder, unspecified  Long Term Goal(s):     Short Term Goals: Ability to identify changes in lifestyle to reduce recurrence of condition will improve Ability to verbalize feelings will improve Ability to disclose and discuss suicidal ideas     Medication Management: Evaluate patient's response, side effects, and tolerance of medication regimen.  Therapeutic Interventions: 1 to 1 sessions, Unit Group sessions and Medication administration.  Evaluation of Outcomes: Progressing   RN Treatment Plan for Primary Diagnosis: Substance induced mood disorder (HCC) Long Term Goal(s): Knowledge of disease and therapeutic regimen to maintain health will improve  Short Term Goals: Ability to remain free from injury will improve, Ability to verbalize frustration and anger appropriately will improve, Ability to demonstrate self-control, Ability to participate in decision making will improve, Ability to verbalize feelings will improve, Ability to disclose and discuss suicidal ideas, Ability to identify and develop effective coping behaviors will improve, and Compliance with prescribed medications will improve  Medication Management: RN will administer medications as ordered by provider, will assess and evaluate patient's response and provide education to patient for prescribed medication. RN will report any adverse and/or side effects to prescribing provider.  Therapeutic Interventions: 1 on 1 counseling sessions, Psychoeducation, Medication administration, Evaluate responses to  treatment, Monitor vital signs and CBGs as ordered, Perform/monitor CIWA, COWS, AIMS and Fall Risk screenings as ordered, Perform wound care treatments as ordered.  Evaluation of Outcomes: Progressing   LCSW Treatment Plan for Primary Diagnosis: Substance induced mood disorder (HCC) Long Term Goal(s): Safe transition to appropriate next level of care at discharge, Engage patient in therapeutic group addressing interpersonal concerns.  Short Term Goals: Engage patient in aftercare planning with referrals and resources, Increase social support, Increase ability to appropriately verbalize feelings, Increase emotional regulation, Facilitate acceptance of mental health diagnosis and concerns, Facilitate patient progression through stages of change regarding substance use diagnoses and concerns, Identify triggers associated with mental health/substance abuse issues, and Increase skills for wellness and recovery  Therapeutic Interventions: Assess for all discharge needs, 1 to 1 time with Social worker, Explore available resources and support systems, Assess for adequacy in community support network, Educate family and significant other(s) on suicide prevention, Complete  Psychosocial Assessment, Interpersonal group therapy.  Evaluation of Outcomes: Progressing   Progress in Treatment: Attending groups: attended one group Participating in groups: Yes. Taking medication as prescribed: Yes. Toleration medication: Yes. Family/Significant other contact made: No, will contact:  Sarrah Fiorenza, husband, [phone] Patient understands diagnosis: Yes. Discussing patient identified problems/goals with staff: Yes. Medical problems stabilized or resolved: Yes. Denies suicidal/homicidal ideation: Yes. Issues/concerns per patient self-inventory: No.   New problem(s) identified:  No  New Short Term/Long Term Goal(s):  medication stabilization, elimination of SI thoughts, development of comprehensive mental  wellness plan.    Patient Goals:  I want to get out of the hospital.    Discharge Plan or Barriers:  Patient recently admitted. CSW will continue to follow and assess for appropriate referrals and possible discharge planning.    Reason for Continuation of Hospitalization: Anxiety Medication stabilization Suicidal ideation  Estimated Length of Stay:  5 - 7 days  Last 3 [place] Suicide Severity Risk Score: Flowsheet Row [facility]  C-SSRS RISK CATEGORY No Risk    Last PHQ 2/9 Scores:     No data to display          Scribe for Treatment Team: Chaeli Judy O Renner Sebald, LCSWA 01/28/2024 5:54 PM
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
## 2024-01-28 NOTE — BHH Group Notes (Signed)
 Psychoeducational Group Note  Date:  01/28/2024 Time:  2000  Group Topic/Focus:  Alcoholics anonymous Meeting  Participation Level: Did Not Attend  Participation Quality:  Not Applicable  Affect:  Not Applicable  Cognitive:  Not Applicable  Insight:  Not Applicable  Engagement in Group: Not Applicable  Additional Comments:  Did not attend.   Lenora Manuelita RAMAN 01/28/2024, 9:31 PM

## 2024-01-28 NOTE — Progress Notes (Addendum)
 D:  Patient's self inventory sheet, patient has poor sleep, no sleep medicine.  Poor appetite, low energy level, poor concentration.  Rated depression, hopeless and anxiety 4.  Denied withdrawals.  Denied SI.  Physical problems, earache.  Goal is be happier.  Plans to set goals.  No discharge plans. A:  Medications administered per MD orders.  Emotional support and encouragement given patient. R:  Denied SI and HI, contracts for safety.  Denied A/V hallucinations.  Safety maintained with 15 minute checks.

## 2024-01-28 NOTE — Group Note (Signed)
 Recreation Therapy Group Note   Group Topic:Problem Solving  Group Date: 01/28/2024 Start Time: 0936 End Time: 1002 Facilitators: Mahir Prabhakar-McCall, LRT,CTRS Location: 300 Hall Dayroom   Group Topic: Communication, Team Building, Problem Solving  Goal Area(s) Addresses:  Patient will effectively work with peer towards shared goal.  Patient will identify skills used to make activity successful.  Patient will identify how skills used during activity can be used to reach post d/c goals.   Behavioral Response:   Intervention: STEM Activity  Activity: Straw Bridge. In teams of 3-5, patients were given 15 plastic drinking straws and an equal length of masking tape. Using the materials provided, patients were instructed to build a free standing bridge-like structure to suspend an everyday item (ex: puzzle box) off of the floor or table surface. All materials were required to be used by the team in their design. LRT facilitated post-activity discussion reviewing team process. Patients were encouraged to reflect how the skills used in this activity can be generalized to daily life post discharge.   Education: Pharmacist, community, Scientist, physiological, Discharge Planning   Education Outcome: Acknowledges education/In group clarification offered/Needs additional education.    Affect/Mood: N/A   Participation Level: Did not attend    Clinical Observations/Individualized Feedback:      Plan: Continue to engage patient in RT group sessions 2-3x/week.   Harrel Ferrone-McCall, LRT,CTRS 01/28/2024 12:09 PM

## 2024-01-29 LAB — TSH: TSH: 1.51 u[IU]/mL (ref 0.350–4.500)

## 2024-01-29 LAB — LIPID PANEL
Cholesterol: 156 mg/dL (ref 0–200)
HDL: 39 mg/dL — ABNORMAL LOW (ref >40)
LDL Cholesterol: 99 mg/dL (ref 0–99)
Total CHOL/HDL Ratio: 4 ratio
Triglycerides: 92 mg/dL (ref <150)
VLDL: 18 mg/dL (ref 0–40)

## 2024-01-29 LAB — HEMOGLOBIN A1C
Hgb A1c MFr Bld: 4.6 % — ABNORMAL LOW (ref 4.8–5.6)
Mean Plasma Glucose: 85.32 mg/dL

## 2024-01-29 MED ORDER — GABAPENTIN 300 MG PO CAPS
300.0000 mg | ORAL_CAPSULE | Freq: Three times a day (TID) | ORAL | Status: DC
  Administered 2024-01-29 – 2024-02-02 (×13): 300 mg via ORAL
  Filled 2024-01-29 (×14): qty 1

## 2024-01-29 MED ORDER — MELATONIN 5 MG PO TABS
10.0000 mg | ORAL_TABLET | Freq: Once | ORAL | Status: AC
  Administered 2024-01-29: 10 mg via ORAL
  Filled 2024-01-29: qty 2

## 2024-01-29 NOTE — Plan of Care (Signed)
  Problem: Education: Goal: Knowledge of [place] [facility] Education information/materials will improve Outcome: Progressing Goal: Verbalization of understanding the information provided will improve Outcome: Progressing   Problem: Activity: Goal: Sleeping patterns will improve Outcome: Progressing   Problem: Coping: Goal: Ability to verbalize frustrations and anger appropriately will improve Outcome: Progressing

## 2024-01-29 NOTE — Progress Notes (Signed)
   01/28/24 1945  Psych Admission Type (Psych Patients Only)  Admission Status Involuntary  Psychosocial Assessment  Patient Complaints Anxiety;Sadness  Eye Contact Brief  Facial Expression Anxious  Affect Anxious  Speech Soft  Interaction Guarded  Motor Activity Slow  Appearance/Hygiene Unremarkable  Behavior Characteristics Cooperative  Mood Depressed  Aggressive Behavior  Effect No apparent injury  Thought Process  Coherency WDL  Content WDL  Delusions None reported or observed  Perception WDL  Hallucination None reported or observed  Judgment WDL  Confusion WDL  Danger to Self  Current suicidal ideation? Denies  Agreement Not to Harm Self Yes  Description of Agreement Verbal  Danger to Others  Danger to Others None reported or observed

## 2024-01-29 NOTE — Group Note (Signed)
 Date:  01/29/2024 Time:  3:30 PM  Group Topic/Focus: Sleep hygiene Wellness Toolbox:   The focus of this group is to discuss various aspects of wellness, balancing those aspects and exploring ways to increase the ability to experience wellness.  Patients will create a wellness toolbox for use upon discharge.    Participation Level:  Did Not Attend   Crystal Ayala 01/29/2024, 3:30 PM

## 2024-01-29 NOTE — Progress Notes (Signed)
   01/29/24 0830  Psych Admission Type (Psych Patients Only)  Admission Status Involuntary  Psychosocial Assessment  Patient Complaints Sadness  Eye Contact Brief  Facial Expression Anxious  Affect Anxious  Speech Soft  Interaction Guarded  Motor Activity Slow  Appearance/Hygiene In scrubs  Behavior Characteristics Cooperative  Mood Depressed  Thought Process  Coherency WDL  Content WDL  Delusions None reported or observed  Perception WDL  Hallucination None reported or observed  Judgment WDL  Confusion None  Danger to Self  Current suicidal ideation? Denies  Agreement Not to Harm Self Yes  Description of Agreement verbal  Danger to Others  Danger to Others None reported or observed

## 2024-01-29 NOTE — Progress Notes (Signed)
(  Sleep Hours) - Estimated Sleeping Duration (Last 24 Hours): 7.75-10.00 hours  (Any PRNs that were needed, meds refused, or side effects to meds)- tylenol , valium  (Any disturbances and when (visitation, over night)-Ear pain (Concerns raised by the patient)- Ear pain (SI/HI/AVH)-Denies
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
## 2024-01-29 NOTE — Plan of Care (Signed)
  Problem: Education: Goal: Knowledge of [facility] Education information/materials will improve Outcome: Progressing Goal: Emotional status will improve Outcome: Progressing Goal: Mental status will improve Outcome: Progressing Goal: Verbalization of understanding the information provided will improve Outcome: Progressing   Problem: Activity: Goal: Interest or engagement in activities will improve Outcome: Progressing Goal: Sleeping patterns will improve Outcome: Progressing   Problem: Health Behavior/Discharge Planning: Goal: Identification of resources available to assist in meeting health care needs will improve Outcome: Progressing Goal: Compliance with treatment plan for underlying cause of condition will improve Outcome: Progressing   Problem: Safety: Goal: Periods of time without injury will increase Outcome: Progressing

## 2024-01-30 MED ORDER — LEVOFLOXACIN 250 MG PO TABS
500.0000 mg | ORAL_TABLET | Freq: Every day | ORAL | Status: DC
  Administered 2024-01-30 – 2024-02-02 (×4): 500 mg via ORAL
  Filled 2024-01-30 (×4): qty 2

## 2024-01-30 MED ORDER — QUETIAPINE FUMARATE 50 MG PO TABS
50.0000 mg | ORAL_TABLET | Freq: Every day | ORAL | Status: DC
  Administered 2024-01-30 – 2024-02-01 (×3): 50 mg via ORAL
  Filled 2024-01-30 (×3): qty 1
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
## 2024-01-30 NOTE — Group Note (Signed)
 Date:  01/30/2024 Time:  10:19 PM  Group Topic/Focus:  Wrap-Up Group:   The focus of this group is to help patients review their daily goal of treatment and discuss progress on daily workbooks.    Additional Comments:  Pt was encouraged, but opted out of attending wrap up group this evening.   Rosella DELENA Pouch 01/30/2024, 10:19 PM

## 2024-01-30 NOTE — Group Note (Signed)
 Date:  01/30/2024 Time:  10:27 AM  Group Topic/Focus:  Goals Group:   The focus of this group is to help patients establish daily goals to achieve during treatment and discuss how the patient can incorporate goal setting into their daily lives to aide in recovery.    Participation Level:  Did Not Attend  Participation Quality:  Did Not Attend  Affect:  Did Not Attend  Cognitive:  Did Not Attend  Insight: None  Engagement in Group:  Did Not Attend  Modes of Intervention:  Did Not Attend  Additional Comments:    Crystal Ayala 01/30/2024, 10:27 AM

## 2024-01-30 NOTE — Plan of Care (Signed)
  Problem: Education: Goal: Knowledge of [facility] Education information/materials will improve Outcome: Completed/Met Goal: Mental status will improve Outcome: Progressing Goal: Verbalization of understanding the information provided will improve Outcome: Progressing   Problem: Activity: Goal: Interest or engagement in activities will improve Outcome: Progressing Goal: Sleeping patterns will improve Outcome: Progressing

## 2024-01-30 NOTE — Progress Notes (Signed)
(  Sleep Hours) -9.5 (Any PRNs that were needed, meds refused, or side effects to meds)- none (Any disturbances and when (visitation, over night)-Valium , Melatonin (Concerns raised by the patient)- none (SI/HI/AVH)- Denies

## 2024-01-30 NOTE — Group Note (Unsigned)
 Date:  01/30/2024 Time:  10:50 AM  Group Topic/Focus:  Goals Group:   The focus of this group is to help patients establish daily goals to achieve during treatment and discuss how the patient can incorporate goal setting into their daily lives to aide in recovery.     Participation Level:  {BHH PARTICIPATION OZCZO:77735}  Participation Quality:  {BHH PARTICIPATION QUALITY:22265}  Affect:  {BHH AFFECT:22266}  Cognitive:  {BHH COGNITIVE:22267}  Insight: {BHH Insight2:20797}  Engagement in Group:  {BHH ENGAGEMENT IN HMNLE:77731}  Modes of Intervention:  {BHH MODES OF INTERVENTION:22269}  Additional Comments:  ***  Crystal Ayala 01/30/2024, 10:50 AM

## 2024-01-30 NOTE — Progress Notes (Signed)
   01/30/24 2244  Psych Admission Type (Psych Patients Only)  Admission Status Involuntary  Psychosocial Assessment  Patient Complaints Depression  Eye Contact Brief  Facial Expression Animated;Anxious  Affect Appropriate to circumstance  Speech Logical/coherent  Interaction Assertive  Motor Activity Other (Comment) (WDL)  Appearance/Hygiene In scrubs  Behavior Characteristics Appropriate to situation  Mood Depressed;Anxious;Pleasant  Thought Process  Coherency WDL  Content WDL  Delusions None reported or observed  Perception WDL  Hallucination None reported or observed  Judgment Poor  Confusion None  Danger to Self  Current suicidal ideation? Denies  Agreement Not to Harm Self Yes  Description of Agreement verbal  Danger to Others  Danger to Others None reported or observed

## 2024-01-30 NOTE — Plan of Care (Signed)
   Problem: Education: Goal: Knowledge of [facility] Education information/materials will improve Outcome: Progressing   Problem: Activity: Goal: Interest or engagement in activities will improve Outcome: Progressing   Problem: Coping: Goal: Ability to verbalize frustrations and anger appropriately will improve Outcome: Progressing   Problem: Safety: Goal: Periods of time without injury will increase Outcome: Progressing

## 2024-01-30 NOTE — Group Note (Signed)
 Date:  01/30/2024 Time:  4:54 PM  Group Topic/Focus:  Self Care:   The focus of this group is to help patients understand the importance of sleep hygiene and self-care in order to improve or restore emotional, physical, spiritual, interpersonal, and financial health.    Additional Comments:  patient did not attend   Powell JAYSON Sharps 01/30/2024, 4:54 PM

## 2024-01-30 NOTE — Group Note (Unsigned)
 Date:  01/30/2024 Time:  8:51 PM  Group Topic/Focus:  Wrap-Up Group:   The focus of this group is to help patients review their daily goal of treatment and discuss progress on daily workbooks.     Participation Level:  {BHH PARTICIPATION OZCZO:77735}  Participation Quality:  {BHH PARTICIPATION QUALITY:22265}  Affect:  {BHH AFFECT:22266}  Cognitive:  {BHH COGNITIVE:22267}  Insight: {BHH Insight2:20797}  Engagement in Group:  {BHH ENGAGEMENT IN HMNLE:77731}  Modes of Intervention:  {BHH MODES OF INTERVENTION:22269}  Additional Comments:  ***  Stephane Niemann A Prisha Hiley 01/30/2024, 8:51 PM

## 2024-01-30 NOTE — Group Note (Unsigned)
 Date:  01/30/2024 Time:  10:53 AM  Group Topic/Focus:  Goals Group:   The focus of this group is to help patients establish daily goals to achieve during treatment and discuss how the patient can incorporate goal setting into their daily lives to aide in recovery.     Participation Level:  {BHH PARTICIPATION OZCZO:77735}  Participation Quality:  {BHH PARTICIPATION QUALITY:22265}  Affect:  {BHH AFFECT:22266}  Cognitive:  {BHH COGNITIVE:22267}  Insight: {BHH Insight2:20797}  Engagement in Group:  {BHH ENGAGEMENT IN HMNLE:77731}  Modes of Intervention:  {BHH MODES OF INTERVENTION:22269}  Additional Comments:  ***  Crystal Ayala 01/30/2024, 10:53 AM

## 2024-01-30 NOTE — Progress Notes (Signed)
   01/30/24 0900  Psych Admission Type (Psych Patients Only)  Admission Status Involuntary  Psychosocial Assessment  Patient Complaints Isolation  Eye Contact Brief  Facial Expression Anxious  Affect Depressed  Speech Logical/coherent  Interaction Assertive  Motor Activity Slow  Appearance/Hygiene In scrubs;Body odor  Behavior Characteristics Cooperative;Appropriate to situation  Mood Depressed  Thought Process  Coherency WDL  Content WDL  Delusions None reported or observed  Perception WDL  Hallucination None reported or observed  Judgment Poor  Confusion None  Danger to Self  Current suicidal ideation? Denies  Agreement Not to Harm Self Yes  Description of Agreement verb  Danger to Others  Danger to Others None reported or observed

## 2024-01-30 NOTE — Group Note (Unsigned)
 Date:  01/30/2024 Time:  9:58 AM  Group Topic/Focus:  Goals Group:   The focus of this group is to help patients establish daily goals to achieve during treatment and discuss how the patient can incorporate goal setting into their daily lives to aide in recovery.     Participation Level:  {BHH PARTICIPATION OZCZO:77735}  Participation Quality:  {BHH PARTICIPATION QUALITY:22265}  Affect:  {BHH AFFECT:22266}  Cognitive:  {BHH COGNITIVE:22267}  Insight: {BHH Insight2:20797}  Engagement in Group:  {BHH ENGAGEMENT IN HMNLE:77731}  Modes of Intervention:  {BHH MODES OF INTERVENTION:22269}  Additional Comments:  ***  Crystal Ayala 01/30/2024, 9:58 AM

## 2024-01-30 NOTE — Group Note (Signed)
 Date:  01/30/2024 Time:  9:55 AM  Group Topic/Focus:  Goals Group:   The focus of this group is to help patients establish daily goals to achieve during treatment and discuss how the patient can incorporate goal setting into their daily lives to aide in recovery.    Participation Level:  Did Not Attend  Participation Quality:  Did Not Attend  Affect:  Did Not Attend  Cognitive:  Did Not Attend  Insight: None  Engagement in Group:  Did Not Attend  Modes of Intervention:  Did Not Attend  Additional Comments:  Did Not Attend  Avelina DELENA Humphreys 01/30/2024, 9:55 AM

## 2024-01-31 MED ORDER — ONDANSETRON HCL 4 MG PO TABS
8.0000 mg | ORAL_TABLET | Freq: Three times a day (TID) | ORAL | Status: DC | PRN
  Administered 2024-01-31 – 2024-02-02 (×3): 8 mg via ORAL
  Filled 2024-01-31 (×3): qty 2
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
## 2024-01-31 NOTE — Progress Notes (Addendum)
(  Sleep Hours) - 12.25 (Any PRNs that were needed, meds refused, or side effects to meds)-  Valium  1 mg (Any disturbances and when (visitation, over night)- None (Concerns raised by the patient)-  None  (SI/HI/AVH)-Denies

## 2024-01-31 NOTE — [facility] Counselor (Signed)
 CSW spoke with patient regarding outpatient mental health services. Patient reported she did not have any current outpatient providers for therapy and medication management. CSW team to provide patient with outpatient appointments prior to discharge.   Crystal Ayala, LCSWA 01/31/24 2:43PM

## 2024-01-31 NOTE — Plan of Care (Signed)
  Problem: Activity: Goal: Sleeping patterns will improve Outcome: Progressing   Problem: Activity: Goal: Interest or engagement in activities will improve Outcome: Not Progressing   

## 2024-01-31 NOTE — Group Note (Signed)
 Date:  01/31/2024 Time:  9:14 PM  Group Topic/Focus:  Wrap-Up Group:   The focus of this group is to help patients review their daily goal of treatment and discuss progress on daily workbooks. Alcoholics Anonymous (AA) Meeting    Participation Level:  Did Not Attend  Participation Quality:  N/A  Affect:  N/A  Cognitive:  N/A  Insight: None  Engagement in Group:  N/A  Modes of Intervention:  N/A  Additional Comments:  Patient did not attend  Eward Mace 01/31/2024, 9:14 PM

## 2024-01-31 NOTE — Group Note (Signed)
 Recreation Therapy Group Note   Group Topic:Health and Wellness  Group Date: 01/31/2024 Start Time: 0932 End Time: 0958 Facilitators: Costas Sena-McCall, LRT,CTRS Location: [address] Dayroom   Group Topic: Wellness  Goal Area(s) Addresses:  Patient will define components of whole wellness. Patient will verbalize benefit of whole wellness.  Behavioral Response:   Intervention: Music  Activity: Exercise. LRT and patients discussed the importance of physical fitness. Patients then took turns leading the group in the exercises of their choosing. Patients completed two rounds of exercise. Patients could take breaks and get water if needed.   Education: Wellness, Building control surveyor.   Education Outcome: Acknowledges education/In group clarification offered/Needs additional education.    Affect/Mood: N/A   Participation Level: Did not attend    Clinical Observations/Individualized Feedback:     Plan: Continue to engage patient in RT group sessions 2-3x/week.   Lindsie Simar-McCall, LRT,CTRS 01/31/2024 12:53 PM

## 2024-01-31 NOTE — Plan of Care (Signed)
  Problem: Education: Goal: Emotional status will improve Outcome: Progressing Goal: Mental status will improve Outcome: Progressing   Problem: Physical Regulation: Goal: Ability to maintain clinical measurements within normal limits will improve Outcome: Progressing   Problem: Safety: Goal: Periods of time without injury will increase Outcome: Progressing   

## 2024-01-31 NOTE — Progress Notes (Signed)
   01/31/24 0900  Psych Admission Type (Psych Patients Only)  Admission Status Involuntary  Psychosocial Assessment  Patient Complaints Depression  Eye Contact Brief  Facial Expression Anxious  Affect Appropriate to circumstance  Speech Logical/coherent  Interaction Assertive  Motor Activity Other (Comment) (WNL for pt)  Appearance/Hygiene In scrubs;Body odor  Behavior Characteristics Appropriate to situation  Mood Anxious;Depressed  Thought Process  Coherency WDL  Content WDL  Delusions None reported or observed  Perception WDL  Hallucination None reported or observed  Judgment Poor  Confusion None  Danger to Self  Current suicidal ideation? Denies  Agreement Not to Harm Self Yes  Description of Agreement verbal  Danger to Others  Danger to Others None reported or observed

## 2024-02-01 MED ORDER — BACITRACIN-NEOMYCIN-POLYMYXIN OINTMENT TUBE
TOPICAL_OINTMENT | Freq: Every day | CUTANEOUS | Status: DC
  Filled 2024-02-01: qty 14.17

## 2024-02-01 NOTE — Plan of Care (Signed)
  Problem: Education: Goal: Verbalization of understanding the information provided will improve Outcome: Progressing   Problem: Health Behavior/Discharge Planning: Goal: Compliance with treatment plan for underlying cause of condition will improve Outcome: Progressing   

## 2024-02-01 NOTE — Plan of Care (Signed)
  Problem: Education: Goal: Mental status will improve Outcome: Progressing   Problem: Activity: Goal: Interest or engagement in activities will improve Outcome: Progressing   Problem: Coping: Goal: Ability to verbalize frustrations and anger appropriately will improve Outcome: Progressing   Problem: Health Behavior/Discharge Planning: Goal: Identification of resources available to assist in meeting health care needs will improve Outcome: Progressing

## 2024-02-01 NOTE — Group Note (Signed)
 Date:  02/01/2024 Time:  4:29 PM  Group Topic/Focus:  Using Ask Me 3 to Understand Treatment    Participation Level:  Did Not Attend   Crystal Ayala 02/01/2024, 4:29 PM

## 2024-02-01 NOTE — Group Note (Signed)
 Recreation Therapy Group Note   Group Topic:Animal Assisted Therapy   Group Date: 02/01/2024 Start Time: 9050 End Time: 1030 Facilitators: Aliscia Clayton-McCall, LRT,CTRS Location: [address] Dayroom   Animal-Assisted Activity (AAA) Program Checklist/Progress Notes Patient Eligibility Criteria Checklist & Daily Group note for Rec Tx Intervention  AAA/T Program Assumption of Risk Form signed by Patient/ or Parent Legal Guardian Yes  Patient understands his/her participation is voluntary Yes  Behavioral Response:    Education: Charity fundraiser, Appropriate Animal Interaction   Education Outcome: Acknowledges education.    Affect/Mood: N/A   Participation Level: Did not attend    Clinical Observations/Individualized Feedback:      Plan: Continue to engage patient in RT group sessions 2-3x/week.   Victorya Hillman-McCall, LRT,CTRS 02/01/2024 12:04 PM

## 2024-02-01 NOTE — [facility] Group Notes (Signed)
 Psychoeducational Group Note  Date:  02/01/2024 Time:  8:30  Group Topic/Focus:  Goals Group:   The focus of this group is to help patients establish daily goals to achieve during treatment and discuss how the patient can incorporate goal setting into their daily lives to aide in recovery.  Participation Level: Did Not Attend  Participation Quality:  Not Applicable  Affect:  Not Applicable  Cognitive:  Not Applicable  Insight:  Not Applicable  Engagement in Group: Not Applicable  Additional Comments:  Pt did not attend goal group. Due to being asleep.  Mika Anastasi, [place] 02/01/2024, 10:09 AM

## 2024-02-01 NOTE — Progress Notes (Signed)
   01/31/24 2230  Psych Admission Type (Psych Patients Only)  Admission Status Involuntary  Psychosocial Assessment  Patient Complaints Depression  Eye Contact Brief  Facial Expression Anxious  Affect Depressed  Speech Logical/coherent  Interaction Assertive  Motor Activity Slow  Appearance/Hygiene In scrubs  Behavior Characteristics Appropriate to situation  Mood Depressed;Sullen  Thought Process  Coherency WDL  Content WDL  Delusions None reported or observed  Perception WDL  Hallucination None reported or observed  Judgment Poor  Confusion None  Danger to Self  Current suicidal ideation? Denies  Agreement Not to Harm Self Yes  Description of Agreement Verbal  Danger to Others  Danger to Others None reported or observed
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
## 2024-02-01 NOTE — Progress Notes (Signed)
 Pt. Slept 9 hrs.on night shift.

## 2024-02-01 NOTE — Progress Notes (Signed)
   02/01/24 0900  Psych Admission Type (Psych Patients Only)  Admission Status Involuntary  Psychosocial Assessment  Patient Complaints Anxiety  Eye Contact Brief  Facial Expression Animated;Anxious  Affect Anxious;Depressed  Speech Logical/coherent  Interaction Assertive  Motor Activity Slow  Appearance/Hygiene In scrubs  Behavior Characteristics Cooperative;Appropriate to situation  Mood Depressed;Anxious  Thought Process  Coherency WDL  Content WDL  Delusions None reported or observed  Perception WDL  Hallucination None reported or observed  Judgment Impaired  Confusion None  Danger to Self  Current suicidal ideation? Denies  Agreement Not to Harm Self Yes  Description of Agreement verbal  Danger to Others  Danger to Others None reported or observed

## 2024-02-01 NOTE — [facility] Group Notes (Signed)
 [facility] Group Notes:  (Nursing/MHT/Case Management/Adjunct)  Date:  02/01/2024  Time:  2000  Type of Therapy:  Wrap up group  Participation Level:  Active  Participation Quality:  Appropriate, Attentive, Sharing, and Supportive  Affect:  Tearful  Cognitive:  Alert  Insight:  Improving  Engagement in Group:  Engaged  Modes of Intervention:  Clarification, Education, and Support  Summary of Progress/Problems: Positive thinking and positive change were discussed.   Lenora Shaver S 02/01/2024, 9:05 PM

## 2024-02-01 NOTE — Group Note (Signed)
 LCSW Group Therapy Note   Group Date: 02/01/2024 Start Time: 1100 End Time: 1200   Participation:  did not attend  Type of Therapy:  Group Therapy  Topic:  Title: Finding Balance: Using Wise Mind for Thoughtful Decisions  Objective:  To help participants understand and apply the concept of [place]elsie Mind to make balanced, thoughtful decisions by integrating emotion and logic.  Goals: Learn the differences between Emotional Mind, Reasonable Mind, and [employer]. Recognize personal signs of Emotional and Reasonable Mind. Practice using [employer] in real-life scenarios.  Therapeutic Modalities: Elements of Dialectical Behavior Therapy (DBT):  Mindfulness (noticing thoughts and emotions without judgment), Emotion Regulation (understanding and managing emotional responses), Distress Tolerance (coping with difficult situations without making them worse), Wise Mind (integrating emotion and reason for balanced decision-making) Elements of Cognitive Behavioral Therapy (CBT):  Identifying automatic thoughts, Challenging cognitive distortions, Using logic to reframe unhelpful thinking patterns  Summary:  This class focused on Wise Mind--DBT's concept of balancing Emotional Mind and Reasonable Mind. We identified when we're in each state and practiced using Wise Mind to respond thoughtfully in real-life situations. By combining emotion and logic, participants can improve decision-making, manage challenges, and enhance relationships.   Janal Haak O Jakia Kennebrew, LCSWA 02/01/2024  12:15 PM

## 2024-02-02 DIAGNOSIS — F1994 Other psychoactive substance use, unspecified with psychoactive substance-induced mood disorder: Principal | ICD-10-CM

## 2024-02-02 MED ORDER — BUPRENORPHINE HCL 8 MG SL SUBL: 8.0000 mg | SUBLINGUAL_TABLET | Freq: Two times a day (BID) | SUBLINGUAL | 0 refills | Status: AC

## 2024-02-02 MED ORDER — LEVOFLOXACIN 500 MG PO TABS: 500.0000 mg | ORAL_TABLET | Freq: Every day | ORAL | 0 refills | Status: AC

## 2024-02-02 MED ORDER — QUETIAPINE FUMARATE 50 MG PO TABS: 50.0000 mg | ORAL_TABLET | Freq: Every day | ORAL | 0 refills | Status: AC

## 2024-02-02 MED ORDER — GABAPENTIN 300 MG PO CAPS: 300.0000 mg | ORAL_CAPSULE | Freq: Three times a day (TID) | ORAL | 0 refills | Status: AC

## 2024-02-02 MED ORDER — SERTRALINE HCL 100 MG PO TABS: 100.0000 mg | ORAL_TABLET | Freq: Every day | ORAL | 0 refills | Status: AC

## 2024-02-02 MED ORDER — NICOTINE 21 MG/24HR TD PT24: 21.0000 mg | MEDICATED_PATCH | Freq: Every day | TRANSDERMAL | 0 refills | Status: AC

## 2024-02-02 MED ORDER — BACITRACIN-NEOMYCIN-POLYMYXIN OINTMENT TUBE: 1.0000 | TOPICAL_OINTMENT | Freq: Every day | CUTANEOUS | Status: AC

## 2024-02-02 MED ORDER — ONDANSETRON HCL 8 MG PO TABS: 8.0000 mg | ORAL_TABLET | Freq: Three times a day (TID) | ORAL | 0 refills | Status: AC | PRN

## 2024-02-02 NOTE — Discharge Instructions (Signed)
-  Follow-up with your outpatient psychiatric provider -instructions on appointment date, time, and address (location) are provided to you in discharge paperwork.  -Take your psychiatric medications as prescribed at discharge - instructions are provided to you in the discharge paperwork  - You are on a flouoroquinolone antibiotic for two more days for your ear infection and your urinary tract infection. Be wary of the following side effects: peripheral neuropathy, heart rhythm prolongation, joint pain and stiffness, tendonitis and, in rare circumstances, tendon rupture. Please call your PCP or follow up at the emergency department if any of these circumstances arise.   -Follow-up with outpatient primary care doctor and other specialists -for management of chronic medical disease, including: High blood pressure readings in hospital setting (160/96 --> 144/104). Right ear erythematous area distal to tympanic membrane, likely otitis externa, will need further workup if symptoms do not resolve with antibiotic therapy.   -Testing: Follow-up with outpatient provider for abnormal lab results: UDS + for GNR >100,000, on appropriate antibiotic therapy.   -Recommend abstinence from alcohol, tobacco, and other illicit drug use at discharge.   -If your psychiatric symptoms recur, worsen, or if you have side effects to your psychiatric medications, call your outpatient psychiatric provider, 911, [phone] or go to the nearest emergency department.  -If suicidal thoughts recur, call your outpatient psychiatric provider, 911, [phone] or go to the nearest emergency department.

## 2024-02-02 NOTE — Plan of Care (Signed)
   Problem: Education: Goal: Emotional status will improve Outcome: Progressing Goal: Mental status will improve Outcome: Progressing Goal: Verbalization of understanding the information provided will improve Outcome: Progressing

## 2024-02-02 NOTE — Progress Notes (Signed)
  [facility] Adult Case Management Discharge Plan :  Will you be returning to the same living situation after discharge:  No. At discharge, do you have transportation home?: Yes,  pt will be transported home via taxi voucher. Do you have the ability to pay for your medications: Yes,  pt is insured - [place] Medicaid [employer]/[place] Medicaid [employer].  Release of information consent forms completed and in the chart;  Patient's signature needed at discharge.  Patient to Follow up at:  Follow-up Information     [facility] Follow up.   Why: You may call this provider to schedule appointments for therapy and medication management services. Contact information: [address] [place] [address] [phone]        [facility]. Go on 02/03/2024.   Why: Please go to this provider on 02/03/24 at 8:30 am for an assessment, to obtain therapy and medication management services. Contact information: [address] [place] [address] [phone]         [facility]. Go on 02/04/2024.   Why: You have an appointment scheduled for 02/04/24 at 8:45am. Please present at [facility] for your suboxone appointment at this time. Contact information: [address] [place], [place] [address] [phone]                Next level of care provider has access to [facility] Link:no  Safety Planning and Suicide Prevention discussed: Yes,  completed with Rock [place] (grandmother) [phone]     Has patient been referred to the Quitline?: Patient refused referral for treatment  Patient has been referred for addiction treatment: Patient refused referral for treatment.  Veroncia Jezek M Evens Meno, LCSWA 02/02/2024, 9:15 AM-

## 2024-02-02 NOTE — Group Note (Signed)
 Recreation Therapy Group Note   Group Topic:Team Building  Group Date: 02/02/2024 Start Time: 0930 End Time: 1000 Facilitators: Kacen Mellinger-McCall, Crystal Ayala,Crystal Ayala Location: [address] Dayroom   Group Topic: Communication, Team Building, Problem Solving  Goal Area(s) Addresses:  Patient will effectively work with peer towards shared goal.  Patient will identify skills used to make activity successful.  Patient will share challenges and verbalize solution-driven approaches used. Patient will identify how skills used during activity can be used to reach post d/c goals.   Behavioral Response:   Intervention: STEM Activity   Activity: [employer]. Patients were provided the following materials: 5 drinking straws, 5 rubber bands, 5 paper clips, 2 index cards, 2 drinking cups, and 2 toilet paper rolls. Using the provided materials patients were asked to build a launching mechanism to launch a ping pong ball across the room, approximately 10 feet. Patients were divided into teams of 3-5. Instructions required all materials be incorporated into the device, functionality of items left to the peer group's discretion.  Education: Pharmacist, community, Scientist, physiological, Air cabin crew, Building control surveyor.   Education Outcome: Acknowledges education/In group clarification offered/Needs additional education.    Affect/Mood: N/A   Participation Level: Did not attend    Clinical Observations/Individualized Feedback:      Plan: Continue to engage patient in RT group sessions 2-3x/week.   Crystal Ayala, Crystal Ayala,Crystal Ayala 02/02/2024 11:47 AM

## 2024-02-02 NOTE — [facility] Suicide Risk Assessment (Signed)
 [facility] INPATIENT:  Family/Significant Other Suicide Prevention Education  Suicide Prevention Education:  Education Completed; Rock Manchester (grandmother) [phone],  (name of family member/significant other) has been identified by the patient as the family member/significant other with whom the patient will be residing, and identified as the person(s) who will aid the patient in the event of a mental health crisis (suicidal ideations/suicide attempt).  With written consent from the patient, the family member/significant other has been provided the following suicide prevention education, prior to the and/or following the discharge of the patient.  Rock confirmed patient will not have access to firearms/guns/weapons after discharge. Rock reported being willing to assist patient should she have a mental health crisis. CSW provided [place] with additional resources for patient and informed her of patient's upcoming outpatient appointments. Rock denied having any safety concerns related to patient discharging to her home.   The suicide prevention education provided includes the following: Suicide risk factors Suicide prevention and interventions National Suicide Hotline telephone number [facility] assessment telephone number [facility] Emergency Assistance 911 [facility] and/or Residential Mobile Crisis Unit telephone number  Request made of family/significant other to: Remove weapons (e.g., guns, rifles, knives), all items previously/currently identified as safety concern.   Remove drugs/medications (over-the-counter, prescriptions, illicit drugs), all items previously/currently identified as a safety concern.  The family member/significant other verbalizes understanding of the suicide prevention education information provided.  The family member/significant other agrees to remove the items of safety concern listed above.  Edyn Qazi M Laquia Rosano, LCSWA 02/02/2024, 10:38 AM

## 2024-02-02 NOTE — Progress Notes (Signed)
 Discharge Note:  Patient discharged via taxi to grandmother's home.  Patient denied SI and HI.  Denied A/V hallucinations.  Patient stated she received all her belongings, clothing, toiletries, misc items, etc.  Patient stated she appreciated all assistance received from [facility] staff.  All required discharge information given.
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
## 2024-02-02 NOTE — Plan of Care (Signed)
 Nurse discussed anxiety, depression and coping skills with patient.

## 2024-02-02 NOTE — Progress Notes (Signed)
(  Sleep Hours) - 8 (Any PRNs that were needed, meds refused, or side effects to meds)- valium , zofran  (Any disturbances and when (visitation, over night)- none (Concerns raised by the patient)- none  (SI/HI/AVH)- denies
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
## 2024-02-02 NOTE — Progress Notes (Signed)
 D:  Patient denied SI and HI, contracts for safety.  Denied A/V hallucinations.   A:  Medications administered per MD orders.  Emotional support and encouragement given patient. R:  Safety maintained with 15 minute checks. Patient stated she will be staying with her grandmother after discharge from [facility] today.
